# Patient Record
Sex: Female | Born: 1960 | Race: Black or African American | Hispanic: No | Marital: Single | Smoking: Never smoker
Health system: Southern US, Community
[De-identification: ages and names within clinical notes are randomized; demographics above are authoritative.]

## PROBLEM LIST (undated history)

## (undated) DIAGNOSIS — G4733 Obstructive sleep apnea (adult) (pediatric): Secondary | ICD-10-CM

## (undated) DIAGNOSIS — T7840XA Allergy, unspecified, initial encounter: Secondary | ICD-10-CM

## (undated) DIAGNOSIS — I509 Heart failure, unspecified: Secondary | ICD-10-CM

## (undated) DIAGNOSIS — I10 Essential (primary) hypertension: Secondary | ICD-10-CM

## (undated) HISTORY — DX: Allergy, unspecified, initial encounter: T78.40XA

---

## 2017-05-06 ENCOUNTER — Inpatient Hospital Stay (HOSPITAL_COMMUNITY)
Admission: EM | Admit: 2017-05-06 | Discharge: 2017-05-08 | DRG: 292 | Disposition: A | Discharge status: Home / Self Care | Payer: Medicare HMO | Attending: Internal Medicine | Admitting: Internal Medicine

## 2017-05-06 ENCOUNTER — Other Ambulatory Visit: Payer: Self-pay

## 2017-05-06 ENCOUNTER — Encounter (HOSPITAL_COMMUNITY): Payer: Self-pay

## 2017-05-06 ENCOUNTER — Emergency Department (HOSPITAL_COMMUNITY): Payer: Medicare HMO

## 2017-05-06 DIAGNOSIS — G4733 Obstructive sleep apnea (adult) (pediatric): Secondary | ICD-10-CM | POA: Diagnosis present

## 2017-05-06 DIAGNOSIS — R0602 Shortness of breath: Secondary | ICD-10-CM

## 2017-05-06 DIAGNOSIS — I5033 Acute on chronic diastolic (congestive) heart failure: Secondary | ICD-10-CM

## 2017-05-06 DIAGNOSIS — I110 Hypertensive heart disease with heart failure: Secondary | ICD-10-CM | POA: Diagnosis not present

## 2017-05-06 DIAGNOSIS — Z8249 Family history of ischemic heart disease and other diseases of the circulatory system: Secondary | ICD-10-CM

## 2017-05-06 DIAGNOSIS — R0902 Hypoxemia: Secondary | ICD-10-CM

## 2017-05-06 DIAGNOSIS — E119 Type 2 diabetes mellitus without complications: Secondary | ICD-10-CM

## 2017-05-06 DIAGNOSIS — T501X6A Underdosing of loop [high-ceiling] diuretics, initial encounter: Secondary | ICD-10-CM | POA: Diagnosis present

## 2017-05-06 DIAGNOSIS — IMO0002 Reserved for concepts with insufficient information to code with codable children: Secondary | ICD-10-CM

## 2017-05-06 DIAGNOSIS — I509 Heart failure, unspecified: Secondary | ICD-10-CM | POA: Insufficient documentation

## 2017-05-06 DIAGNOSIS — Z6841 Body Mass Index (BMI) 40.0 and over, adult: Secondary | ICD-10-CM

## 2017-05-06 DIAGNOSIS — Z9119 Patient's noncompliance with other medical treatment and regimen: Secondary | ICD-10-CM

## 2017-05-06 DIAGNOSIS — E1165 Type 2 diabetes mellitus with hyperglycemia: Secondary | ICD-10-CM

## 2017-05-06 DIAGNOSIS — E66813 Obesity, class 3: Secondary | ICD-10-CM

## 2017-05-06 HISTORY — DX: Essential (primary) hypertension: I10

## 2017-05-06 HISTORY — DX: Heart failure, unspecified: I50.9

## 2017-05-06 HISTORY — DX: Obstructive sleep apnea (adult) (pediatric): G47.33

## 2017-05-06 LAB — CBC WITH DIFFERENTIAL/PLATELET
BASOS ABS: 0 10*3/uL (ref 0.0–0.1)
Basophils Relative: 0 %
Eosinophils Absolute: 0.3 10*3/uL (ref 0.0–0.7)
Eosinophils Relative: 2 %
HCT: 46.6 % — ABNORMAL HIGH (ref 36.0–46.0)
HEMOGLOBIN: 15.4 g/dL — AB (ref 12.0–15.0)
LYMPHS PCT: 21 %
Lymphs Abs: 2.7 10*3/uL (ref 0.7–4.0)
MCH: 27.4 pg (ref 26.0–34.0)
MCHC: 33 g/dL (ref 30.0–36.0)
MCV: 82.9 fL (ref 78.0–100.0)
Monocytes Absolute: 0.7 10*3/uL (ref 0.1–1.0)
Monocytes Relative: 6 %
NEUTROS ABS: 8.9 10*3/uL — AB (ref 1.7–7.7)
NEUTROS PCT: 71 %
Platelets: 215 10*3/uL (ref 150–400)
RBC: 5.62 MIL/uL — AB (ref 3.87–5.11)
RDW: 16.7 % — ABNORMAL HIGH (ref 11.5–15.5)
WBC: 12.6 10*3/uL — AB (ref 4.0–10.5)

## 2017-05-06 LAB — URINALYSIS, ROUTINE W REFLEX MICROSCOPIC
BILIRUBIN URINE: NEGATIVE
Glucose, UA: NEGATIVE mg/dL
KETONES UR: NEGATIVE mg/dL
LEUKOCYTES UA: NEGATIVE
Nitrite: NEGATIVE
PH: 5 (ref 5.0–8.0)
Protein, ur: NEGATIVE mg/dL
Specific Gravity, Urine: 1.013 (ref 1.005–1.030)

## 2017-05-06 LAB — COMPREHENSIVE METABOLIC PANEL
ALK PHOS: 64 U/L (ref 38–126)
ALT: 30 U/L (ref 14–54)
AST: 28 U/L (ref 15–41)
Albumin: 3.5 g/dL (ref 3.5–5.0)
Anion gap: 11 (ref 5–15)
BUN: 18 mg/dL (ref 6–20)
CALCIUM: 9.6 mg/dL (ref 8.9–10.3)
CHLORIDE: 105 mmol/L (ref 101–111)
CO2: 24 mmol/L (ref 22–32)
CREATININE: 1.29 mg/dL — AB (ref 0.44–1.00)
GFR calc Af Amer: 53 mL/min — ABNORMAL LOW (ref >60)
GFR, EST NON AFRICAN AMERICAN: 45 mL/min — AB (ref >60)
Glucose, Bld: 168 mg/dL — ABNORMAL HIGH (ref 65–99)
Potassium: 3.7 mmol/L (ref 3.5–5.1)
Sodium: 140 mmol/L (ref 135–145)
Total Bilirubin: 0.7 mg/dL (ref 0.3–1.2)
Total Protein: 6.5 g/dL (ref 6.5–8.1)

## 2017-05-06 LAB — BRAIN NATRIURETIC PEPTIDE: B NATRIURETIC PEPTIDE 5: 263.1 pg/mL — AB (ref 0.0–100.0)

## 2017-05-06 LAB — I-STAT TROPONIN, ED
TROPONIN I, POC: 0.01 ng/mL (ref 0.00–0.08)
TROPONIN I, POC: 0.01 ng/mL (ref 0.00–0.08)

## 2017-05-06 MED ORDER — ACETAMINOPHEN 325 MG PO TABS: 650.0000 mg | ORAL_TABLET | Freq: Four times a day (QID) | ORAL | Status: DC | PRN

## 2017-05-06 MED ORDER — FUROSEMIDE 10 MG/ML IJ SOLN
40.0000 mg | Freq: Every day | INTRAMUSCULAR | Status: DC
  Administered 2017-05-07: 40 mg via INTRAVENOUS
  Filled 2017-05-06: qty 4

## 2017-05-06 MED ORDER — ACETAMINOPHEN 650 MG RE SUPP: 650.0000 mg | Freq: Four times a day (QID) | RECTAL | Status: DC | PRN

## 2017-05-06 MED ORDER — SODIUM CHLORIDE 0.9% FLUSH
3.0000 mL | Freq: Two times a day (BID) | INTRAVENOUS | Status: DC
  Administered 2017-05-06 – 2017-05-08 (×3): 3 mL via INTRAVENOUS

## 2017-05-06 MED ORDER — LISINOPRIL 20 MG PO TABS
20.0000 mg | ORAL_TABLET | Freq: Every day | ORAL | Status: DC
  Administered 2017-05-07 – 2017-05-08 (×2): 20 mg via ORAL
  Filled 2017-05-06 (×2): qty 1

## 2017-05-06 MED ORDER — ENOXAPARIN SODIUM 30 MG/0.3ML ~~LOC~~ SOLN
30.0000 mg | SUBCUTANEOUS | Status: DC
  Administered 2017-05-07: 30 mg via SUBCUTANEOUS
  Filled 2017-05-06 (×2): qty 0.3

## 2017-05-06 MED ORDER — FUROSEMIDE 10 MG/ML IJ SOLN
40.0000 mg | Freq: Once | INTRAMUSCULAR | Status: AC
  Administered 2017-05-06: 40 mg via INTRAVENOUS
  Filled 2017-05-06: qty 4

## 2017-05-06 MED ORDER — SODIUM CHLORIDE 0.9% FLUSH: 3.0000 mL | INTRAVENOUS | Status: DC | PRN

## 2017-05-06 MED ORDER — CARVEDILOL 12.5 MG PO TABS
12.5000 mg | ORAL_TABLET | Freq: Two times a day (BID) | ORAL | Status: DC
  Administered 2017-05-07 – 2017-05-08 (×3): 12.5 mg via ORAL
  Filled 2017-05-06 (×3): qty 1

## 2017-05-06 MED ORDER — SODIUM CHLORIDE 0.9 % IV SOLN: 250.0000 mL | INTRAVENOUS | Status: DC | PRN

## 2017-05-06 NOTE — ED Provider Notes (Signed)
[facility] EMERGENCY DEPARTMENT Provider Note   CSN: [id number] Arrival date & time: 05/06/17  1709     History   Chief Complaint Chief Complaint  Patient presents with  . Shortness of Breath    HPI Robin Oliver is a 57 y.o. female.  The history is provided by the patient and medical records. No language interpreter was used.  Shortness of Breath  This is a recurrent problem. The average episode lasts 1 week. The problem occurs continuously.The current episode started more than 2 days ago. The problem has been gradually worsening. Associated symptoms include cough and leg swelling. Pertinent negatives include no fever, no headaches, no rhinorrhea, no neck pain, no sputum production, no hemoptysis, no wheezing, no chest pain, no syncope, no vomiting, no abdominal pain, no rash and no leg pain. She has tried nothing for the symptoms. The treatment provided no relief. Associated medical issues include heart failure.    Past Medical History:  Diagnosis Date  . CHF (congestive heart failure) (HCC)   . Hypertension     There are no active problems to display for this patient.   History reviewed. No pertinent surgical history.  OB History    No data available       Home Medications    Prior to Admission medications   Not on File    Family History No family history on file.  Social History Social History   Tobacco Use  . Smoking status: Never Smoker  . Smokeless tobacco: Never Used  Substance Use Topics  . Alcohol use: No    Frequency: Never  . Drug use: No     Allergies   Patient has no allergy information on record.   Review of Systems Review of Systems  Constitutional: Positive for fatigue. Negative for chills, diaphoresis and fever.  HENT: Negative for congestion and rhinorrhea.   Respiratory: Positive for cough, chest tightness and shortness of breath. Negative for hemoptysis, sputum production, choking, wheezing and stridor.     Cardiovascular: Positive for leg swelling. Negative for chest pain and syncope.  Gastrointestinal: Negative for abdominal pain, constipation, diarrhea, nausea and vomiting.  Genitourinary: Negative for dysuria, enuresis and flank pain.  Musculoskeletal: Negative for back pain, neck pain and neck stiffness.  Skin: Negative for rash and wound.  Neurological: Negative for weakness, light-headedness, numbness and headaches.  Psychiatric/Behavioral: Negative for agitation.  All other systems reviewed and are negative.    Physical Exam Updated Vital Signs BP (!) 140/95   Pulse 76   Temp 98.2 F (36.8 C) (Oral)   Resp 18   Ht 5\' 4"  (1.626 m)   Wt 131.5 kg (290 lb)   SpO2 96%   BMI 49.78 kg/m   Physical Exam  Constitutional: She is oriented to person, place, and time. She appears well-developed and well-nourished. No distress.  HENT:  Head: Normocephalic.  Nose: Nose normal.  Mouth/Throat: Oropharynx is clear and moist. No oropharyngeal exudate.  Eyes: Conjunctivae and EOM are normal. Pupils are equal, round, and reactive to light.  Neck: Normal range of motion.  Cardiovascular: Normal rate and intact distal pulses.  No murmur heard. Pulmonary/Chest: Effort normal. No respiratory distress. She has no wheezes. She has rales.  Abdominal: Soft. There is no tenderness. There is no guarding.  Musculoskeletal: She exhibits edema. She exhibits no tenderness.  Neurological: She is alert and oriented to person, place, and time. No sensory deficit. She exhibits normal muscle tone.  Skin: Capillary refill takes less than  2 seconds. No rash noted. She is not diaphoretic. No erythema.  Psychiatric: She has a normal mood and affect.  Nursing note and vitals reviewed.    ED Treatments / Results  Labs (all labs ordered are listed, but only abnormal results are displayed) Labs Reviewed  CBC WITH DIFFERENTIAL/PLATELET - Abnormal; Notable for the following components:      Result Value   WBC  12.6 (*)    RBC 5.62 (*)    Hemoglobin 15.4 (*)    HCT 46.6 (*)    RDW 16.7 (*)    Neutro Abs 8.9 (*)    All other components within normal limits  COMPREHENSIVE METABOLIC PANEL - Abnormal; Notable for the following components:   Glucose, Bld 168 (*)    Creatinine, Ser 1.29 (*)    GFR calc non Af Amer 45 (*)    GFR calc Af Amer 53 (*)    All other components within normal limits  BRAIN NATRIURETIC PEPTIDE - Abnormal; Notable for the following components:   B Natriuretic Peptide 263.1 (*)    All other components within normal limits  URINALYSIS, ROUTINE W REFLEX MICROSCOPIC - Abnormal; Notable for the following components:   Hgb urine dipstick LARGE (*)    Bacteria, UA RARE (*)    Squamous Epithelial / LPF 0-5 (*)    All other components within normal limits  URINE CULTURE  HIV ANTIBODY (ROUTINE TESTING)  COMPREHENSIVE METABOLIC PANEL  CBC  TSH  TROPONIN I  TROPONIN I  TROPONIN I  I-STAT TROPONIN, ED  I-STAT TROPONIN, ED    EKG  EKG Interpretation  Date/Time:  Sunday May 06 2017 17:38:26 EST Ventricular Rate:  77 PR Interval:    QRS Duration: 88 QT Interval:  400 QTC Calculation: 453 R Axis:   6 Text Interpretation:  Sinus rhythm Baseline wander in lead(s) III aVF No prior ECG for comparison.  No STEMI Confirmed by Theda Belfast ([id number]) on 05/06/2017 7:36:22 PM       Radiology Dg Chest 2 View  Result Date: 05/06/2017 CLINICAL DATA:  Shortness of breath with exertion, hypoxia, cough, history CHF EXAM: CHEST  2 VIEW COMPARISON:  None FINDINGS: Enlargement of cardiac silhouette. Mediastinal contours and pulmonary vascularity normal. Lungs clear. No acute infiltrate, pleural effusion or pneumothorax. Bones unremarkable. IMPRESSION: Enlargement of cardiac silhouette without acute infiltrate. Electronically Signed   By: Ulyses Southward M.D.   On: 05/06/2017 18:40    Procedures Procedures (including critical care time)  Medications Ordered in ED Medications    carvedilol (COREG) tablet 12.5 mg (not administered)  lisinopril (PRINIVIL,ZESTRIL) tablet 20 mg (not administered)  enoxaparin (LOVENOX) injection 30 mg (not administered)  sodium chloride flush (NS) 0.9 % injection 3 mL (not administered)  sodium chloride flush (NS) 0.9 % injection 3 mL (not administered)  0.9 %  sodium chloride infusion (not administered)  acetaminophen (TYLENOL) tablet 650 mg (not administered)    Or  acetaminophen (TYLENOL) suppository 650 mg (not administered)  furosemide (LASIX) injection 40 mg (not administered)  furosemide (LASIX) injection 40 mg (40 mg Intravenous Given 05/06/17 2217)     Initial Impression / Assessment and Plan / ED Course  I have reviewed the triage vital signs and the nursing notes.  Pertinent labs & imaging results that were available during my care of the patient were reviewed by me and considered in my medical decision making (see chart for details).     Robin Oliver is a 57 y.o. female with a past medical  history significant for congestive heart failure with a reported EF of 10% previously and hypertension who presents with peripheral edema, shortness of breath with exertion, and hypoxia with ambulation.  According to patient, due to recently moving to this area and changing her insurance company, she has not had her Lasix for the last week.  She reports that she has developed peripheral edema in her legs, abdomen, and her arms.  She reports that she is having shortness of breath and severe fatigue.  She reports that when she tries to ambulate she gets very winded which is different than her norm.  She denies chest pain or chest pressure.  She says that she has had a cough with some productive phlegm since she moved to the area but thinks this may be just allergies.  She denies fevers, chills, nausea, vomiting.  On exam, patient has decreased breath sounds in the bases bilaterally as well as some crackles.  No wheezing or rhonchi  appreciated.  No chest tenderness or back tenderness.  No abdominal tenderness.  Patient has pitting edema in both legs and she feels her hands are swollen.  No focal neurologic deficits.  Patient had symmetric pulses in upper and lower extremities.  Clinically I am concerned about fluid overload given the reported missed doses of Lasix.  According to the patient and EMS, patient was found to be hypoxic with ambulation at urgent care with a sat in the 80s.  At rest, she is in the low 90s.  This prompted the transfer for further workup.  Patient will have her ambulatory oxygen rechecked and will have screening laboratory testing and imaging.  Given the cough I will look for pneumonia.  I will also look for kidney dysfunction with the edema.  Occult infection could also contribute to fatigue.  Anticipate reassessment after workup.  Patient had negative troponin x2.  Urinalysis did not show infection.  CBC showed evidence of mild leukocytosis and elevated hemoglobin, considering intravascular dehydration despite my clinical concern for overall fluid overload.  BNP was however elevated at 263.  There is no prior for comparison and patient is obese so I am concerned this is reflecting a significant amount of fluid overload.  Chest x-ray showed enlarged cardiac silhouette without acute infiltrate.  No pneumonia however clinically, she had rales in the lungs consistent with pulmonary edema.  Patient was ambulated and became very short of breath.  According to the tech, patient's heart rate jumped into the 110's to 120s.  Oxygen saturation remained in the low 90s but patient had to stop because she was so winded.  This is similar to the patient getting hypoxic at the doctor's office earlier into the 80s.  Clinically, I am still concerned of fluid overload and CHF exacerbation.  Given patient's reported 10% EF and the fluid overload, I feel patient needs admission.  Patient does not feel safe going home given  the shortness of breath when she takes several steps.  Patient given dose of IV Lasix and will be called for admission.    Final Clinical Impressions(s) / ED Diagnoses   Final diagnoses:  Hypoxia  Shortness of breath  Acute on chronic congestive heart failure, unspecified heart failure type (HCC)    Clinical Impression: 1. Hypoxia   2. Shortness of breath   3. Acute on chronic congestive heart failure, unspecified heart failure type (HCC)     Disposition: Admit  This note was prepared with assistance of Dragon voice recognition software. Occasional wrong-word or  sound-a-like substitutions may have occurred due to the inherent limitations of voice recognition software.     Tegeler, Canary Brimhristopher J, MD 05/06/17 [phone]

## 2017-05-06 NOTE — ED Triage Notes (Signed)
Pt brought in by [facility] from home for SOB on exertion. Pt states she moved to [place] a month ago and has been put of her lasix x4 days. Pt endorses a cough that worsens at night. Pt is A+Ox4 and is in no apparent distress at this time. Pt ambulated to bathroom with steady gait.

## 2017-05-06 NOTE — ED Notes (Signed)
Patient transported to X-ray 

## 2017-05-06 NOTE — ED Notes (Signed)
Pt ambulated while on pulse oximetry, Pt's O2 sats remained in the 92-94 range throughout the walk HR in the 115-127 range.

## 2017-05-06 NOTE — H&P (Addendum)
[facility] H&P   Patient Demographics:    Robin Oliver, is a 57 y.o. female  MRN: [id number]   DOB - 07/05/1960  Admit Date - 05/06/2017  Outpatient Primary MD for the patient is Shawn Stall, MD  Referring MD/NP/PA: Chriss Czar  Outpatient Specialists:    Cardiology [facility]  Patient coming from: home  Chief Complaint  Patient presents with  . Shortness of Breath      HPI:    Robin Oliver  is a 57 y.o. female, w hypertension, CHF (EF 10% reported by pt), apparently c/o dyspnea and edema. And weight gain.  Pt notes that she has been without her lasix for 7 days, and during that time her feet have been swelling and she has gained weight and her dyspnea was worse.  Pt denies fever, chills, cough, cp, palp, n/v, diarrhea, brbpr.  Pt was seen at urgent care and told to come to ED.   In ED, pt given lasix and pox >90, but pt feels still clinically dyspneic.  And afraid to go home.   CXR  IMPRESSION: Enlargement of cardiac silhouette without acute infiltrate.  Wbc 12.6, Hgb 15.4, Plt 215 Na 140, K 3.7, Bun 18, Creatinine 1.29 Ast 28, Alt 30 BNP 263.1  Urinalysis Rbc 6-30   Trop 0.01   Pt will be admitted for CHF.         Review of systems:    In addition to the HPI above,  No Fever-chills, No Headache, No changes with Vision or hearing, No problems swallowing food or Liquids, No Chest pain, Cough  No Abdominal pain, No Nausea or Vommitting, Bowel movements are regular, No Blood in stool or Urine, No dysuria, No new skin rashes or bruises, No new joints pains-aches,  No new weakness, tingling, numbness in any extremity, No recent weight gain or loss, No polyuria, polydypsia or polyphagia, No significant Mental Stressors.  A full 10 point Review of Systems was done, except as stated above, all other Review of Systems were negative.   With Past History  of the following :    Past Medical History:  Diagnosis Date  . CHF (congestive heart failure) (HCC)   . Hypertension   . OSA (obstructive sleep apnea)       History reviewed. No pertinent surgical history.    Social History:     Social History   Tobacco Use  . Smoking status: Never Smoker  . Smokeless tobacco: Never Used  Substance Use Topics  . Alcohol use: No    Frequency: Never     Lives -  At home Mobility -  Walks by self   Family History :     Family History  Problem Relation Age of Onset  . Congestive Heart Failure Mother       Home Medications:   Prior to Admission medications   Medication Sig Start Date  End Date Taking? Authorizing Provider  carvedilol (COREG) 12.5 MG tablet Take 12.5 mg by mouth 2 (two) times daily with a meal.   Yes [provider]  furosemide (LASIX) 40 MG tablet Take 40 mg by mouth daily.   Yes [provider]  lisinopril (PRINIVIL,ZESTRIL) 20 MG tablet Take 20 mg by mouth daily.   Yes [provider]     Allergies:     Allergies  Allergen Reactions  . Peanut-Containing Drug Products Anaphylaxis     Physical Exam:   Vitals  Blood pressure 129/63, pulse 88, temperature 98.2 F (36.8 C), temperature source Oral, resp. rate 20, height 5\' 4"  (1.626 m), weight 131.5 kg (290 lb), SpO2 96 %.   1. General lying in bed in NAD,    2. Normal affect and insight, Not Suicidal or Homicidal, Awake Alert, Oriented X 3.  3. No F.N deficits, ALL C.Nerves Intact, Strength 5/5 all 4 extremities, Sensation intact all 4 extremities, Plantars down going.  4. Ears and Eyes appear Normal, Conjunctivae clear, PERRLA. Moist Oral Mucosa.  5. Supple Neck, No JVD, No cervical lymphadenopathy appriciated, No Carotid Bruits.  6. Symmetrical Chest wall movement, trace crackles lung base, no wheezing.   7. RRR, No Gallops, Rubs or Murmurs, No Parasternal Heave.  8. Positive Bowel Sounds, Abdomen Soft, No tenderness,  No organomegaly appriciated,No rebound -guarding or rigidity.  9.  No Cyanosis,  Feet trace edema.   10. Good muscle tone,  joints appear normal , no effusions, Normal ROM.  11. No Palpable Lymph Nodes in Neck or Axillae     Data Review:    CBC Recent Labs  Lab 05/06/17 1809  WBC 12.6*  HGB 15.4*  HCT 46.6*  PLT 215  MCV 82.9  MCH 27.4  MCHC 33.0  RDW 16.7*  LYMPHSABS 2.7  MONOABS 0.7  EOSABS 0.3  BASOSABS 0.0   ------------------------------------------------------------------------------------------------------------------  Chemistries  Recent Labs  Lab 05/06/17 1809  NA 140  K 3.7  CL 105  CO2 24  GLUCOSE 168*  BUN 18  CREATININE 1.29*  CALCIUM 9.6  AST 28  ALT 30  ALKPHOS 64  BILITOT 0.7   ------------------------------------------------------------------------------------------------------------------ estimated creatinine clearance is 65.6 mL/min (A) (by C-G formula based on SCr of 1.29 mg/dL (H)). ------------------------------------------------------------------------------------------------------------------ No results for input(s): TSH, T4TOTAL, T3FREE, THYROIDAB in the last 72 hours.  Invalid input(s): FREET3  Coagulation profile No results for input(s): INR, PROTIME in the last 168 hours. ------------------------------------------------------------------------------------------------------------------- No results for input(s): DDIMER in the last 72 hours. -------------------------------------------------------------------------------------------------------------------  Cardiac Enzymes No results for input(s): CKMB, TROPONINI, MYOGLOBIN in the last 168 hours.  Invalid input(s): CK ------------------------------------------------------------------------------------------------------------------    Component Value Date/Time   BNP 263.1 (H) 05/06/2017 1809      ---------------------------------------------------------------------------------------------------------------  Urinalysis    Component Value Date/Time   COLORURINE YELLOW 05/06/2017 1815   APPEARANCEUR CLEAR 05/06/2017 1815   LABSPEC 1.013 05/06/2017 1815   PHURINE 5.0 05/06/2017 1815   GLUCOSEU NEGATIVE 05/06/2017 1815   HGBUR LARGE (A) 05/06/2017 1815   BILIRUBINUR NEGATIVE 05/06/2017 1815   KETONESUR NEGATIVE 05/06/2017 1815   PROTEINUR NEGATIVE 05/06/2017 1815   NITRITE NEGATIVE 05/06/2017 1815   LEUKOCYTESUR NEGATIVE 05/06/2017 1815    ----------------------------------------------------------------------------------------------------------------   Imaging Results:    Dg Chest 2 View  Result Date: 05/06/2017 CLINICAL DATA:  Shortness of breath with exertion, hypoxia, cough, history CHF EXAM: CHEST  2 VIEW COMPARISON:  None FINDINGS: Enlargement of cardiac silhouette. Mediastinal contours and pulmonary vascularity normal. Lungs  clear. No acute infiltrate, pleural effusion or pneumothorax. Bones unremarkable. IMPRESSION: Enlargement of cardiac silhouette without acute infiltrate. Electronically Signed   By: Ulyses Southward M.D.   On: 05/06/2017 18:40       Assessment & Plan:    Active Problems:   CHF (congestive heart failure) (HCC)    Dyspnea secondary to Acute on Chronic Systolic CHF Tele Trop I q6h x3 Check tsh Check cardiac echo Start  Lasix 40mg  iv qday Cont Carvedilol Cardiology consult , sent by email for AM consultation  OSA , pt is not wearing cpap Recommend outpatient follow up   Hyperglycemia Check hga1c  DVT Prophylaxis Lovenox - SCDs  AM Labs Ordered, also please review Full Orders  Family Communication: Admission, patients condition and plan of care including tests being ordered have been discussed with the patient  who indicate understanding and agree with the plan and Code Status.  Code Status FULL CODE  Likely DC to   home  Condition GUARDED    Consults called:  Cardiology by email  Admission status: observation   Time spent in minutes : 45   Pearson Grippe M.D on 05/06/2017 at 11:06 PM  Between 7am to 7pm - Pager - [phone]  . After 7pm go to [email or web address] - password [email or web address]  [facility] - Office  [phone]

## 2017-05-06 NOTE — ED Notes (Signed)
Lab called- BNP in process

## 2017-05-07 ENCOUNTER — Other Ambulatory Visit (HOSPITAL_COMMUNITY): Payer: Medicare HMO

## 2017-05-07 ENCOUNTER — Observation Stay (HOSPITAL_COMMUNITY): Payer: Medicare HMO

## 2017-05-07 DIAGNOSIS — I509 Heart failure, unspecified: Secondary | ICD-10-CM

## 2017-05-07 DIAGNOSIS — R0602 Shortness of breath: Secondary | ICD-10-CM

## 2017-05-07 DIAGNOSIS — Z8249 Family history of ischemic heart disease and other diseases of the circulatory system: Secondary | ICD-10-CM | POA: Diagnosis not present

## 2017-05-07 DIAGNOSIS — I5033 Acute on chronic diastolic (congestive) heart failure: Secondary | ICD-10-CM | POA: Diagnosis present

## 2017-05-07 DIAGNOSIS — G4733 Obstructive sleep apnea (adult) (pediatric): Secondary | ICD-10-CM | POA: Diagnosis present

## 2017-05-07 DIAGNOSIS — E1165 Type 2 diabetes mellitus with hyperglycemia: Secondary | ICD-10-CM | POA: Diagnosis present

## 2017-05-07 DIAGNOSIS — Z9119 Patient's noncompliance with other medical treatment and regimen: Secondary | ICD-10-CM | POA: Diagnosis not present

## 2017-05-07 DIAGNOSIS — Z6841 Body Mass Index (BMI) 40.0 and over, adult: Secondary | ICD-10-CM | POA: Diagnosis not present

## 2017-05-07 DIAGNOSIS — T501X6A Underdosing of loop [high-ceiling] diuretics, initial encounter: Secondary | ICD-10-CM | POA: Diagnosis present

## 2017-05-07 DIAGNOSIS — R0902 Hypoxemia: Secondary | ICD-10-CM | POA: Diagnosis present

## 2017-05-07 DIAGNOSIS — I110 Hypertensive heart disease with heart failure: Secondary | ICD-10-CM | POA: Diagnosis present

## 2017-05-07 LAB — COMPREHENSIVE METABOLIC PANEL
ALT: 29 U/L (ref 14–54)
ANION GAP: 12 (ref 5–15)
AST: 25 U/L (ref 15–41)
Albumin: 3.4 g/dL — ABNORMAL LOW (ref 3.5–5.0)
Alkaline Phosphatase: 63 U/L (ref 38–126)
BILIRUBIN TOTAL: 0.9 mg/dL (ref 0.3–1.2)
BUN: 19 mg/dL (ref 6–20)
CHLORIDE: 106 mmol/L (ref 101–111)
CO2: 22 mmol/L (ref 22–32)
Calcium: 9.2 mg/dL (ref 8.9–10.3)
Creatinine, Ser: 1.13 mg/dL — ABNORMAL HIGH (ref 0.44–1.00)
GFR, EST NON AFRICAN AMERICAN: 53 mL/min — AB (ref >60)
Glucose, Bld: 213 mg/dL — ABNORMAL HIGH (ref 65–99)
POTASSIUM: 3.6 mmol/L (ref 3.5–5.1)
Sodium: 140 mmol/L (ref 135–145)
TOTAL PROTEIN: 6.8 g/dL (ref 6.5–8.1)

## 2017-05-07 LAB — CBC
HEMATOCRIT: 44.1 % (ref 36.0–46.0)
HEMOGLOBIN: 15 g/dL (ref 12.0–15.0)
MCH: 28 pg (ref 26.0–34.0)
MCHC: 34 g/dL (ref 30.0–36.0)
MCV: 82.4 fL (ref 78.0–100.0)
Platelets: 229 10*3/uL (ref 150–400)
RBC: 5.35 MIL/uL — AB (ref 3.87–5.11)
RDW: 16.3 % — ABNORMAL HIGH (ref 11.5–15.5)
WBC: 10.9 10*3/uL — AB (ref 4.0–10.5)

## 2017-05-07 LAB — URINE CULTURE: Culture: NO GROWTH

## 2017-05-07 LAB — TROPONIN I

## 2017-05-07 LAB — TSH: TSH: 1.793 u[IU]/mL (ref 0.350–4.500)

## 2017-05-07 MED ORDER — FUROSEMIDE 10 MG/ML IJ SOLN
40.0000 mg | Freq: Two times a day (BID) | INTRAMUSCULAR | Status: DC
  Administered 2017-05-07: 40 mg via INTRAVENOUS
  Filled 2017-05-07: qty 4

## 2017-05-07 MED ORDER — ENOXAPARIN SODIUM 40 MG/0.4ML ~~LOC~~ SOLN
40.0000 mg | SUBCUTANEOUS | Status: DC
  Administered 2017-05-08: 40 mg via SUBCUTANEOUS
  Filled 2017-05-07: qty 0.4

## 2017-05-07 NOTE — ED Notes (Signed)
Called 3E to give report .  Did not answer phone

## 2017-05-07 NOTE — Progress Notes (Signed)
Patient states she has sleep apnea (also listed in history), says she has been wanting a CPAP, but is trying to see if her new insurance will cover it.

## 2017-05-07 NOTE — Progress Notes (Signed)
  Echocardiogram 2D Echocardiogram was attempted but patient wanted to eat will try again tomorrow.   Tye SavoyCasey N Seanpatrick Maisano 05/07/2017, 1:42 PM

## 2017-05-07 NOTE — Progress Notes (Signed)
PROGRESS NOTE    Robin Oliver  WUJ:[id number] DOB: 1960/05/02 DOA: 05/06/2017 PCP: Shawn Stallodd, Dana L, MD   Outpatient Specialists:     Brief Narrative:  Robin Oliver  is a 57 y.o. female, w hypertension, CHF (EF 10% reported by pt), apparently c/o dyspnea and edema. And weight gain.  Pt notes that she has been without her lasix for 7 days, and during that time her feet have been swelling and she has gained weight and her dyspnea was worse.  Pt denies fever, chills, cough, cp, palp, n/v, diarrhea, brbpr.  Pt was seen at urgent care and told to come to ED.   In ED, pt given lasix and pox >90, but pt feels still clinically dyspneic.  And afraid to go home.       Assessment & Plan:   Active Problems:   CHF (congestive heart failure) (HCC)   Dyspnea secondary to Acute on Chronic Systolic CHF- self reported to be 10% -unable to pull in records from prior hospitals-- both [facility] as well as [facility] -history is very suspicious as not able to provide detail or find information in care everywhere -per patient, ran out of medications as [employer] never sent through mail -CE negative - tsh normal Check cardiac echo - Lasix 40mg  iv qday Cont Carvedilol  OSA , pt is not wearing cpap Recommend outpatient follow up   Hyperglycemia -ordered HgbA1c  Morbid obesity Body mass index is 49.78 kg/m.    DVT prophylaxis:  Lovenox   Code Status: Full Code   Family Communication:   Disposition Plan:     Consultants:      Subjective: Urinating large amounts, still gets SOB when laying back  Objective: Vitals:   05/07/17 0847 05/07/17 1130 05/07/17 1130 05/07/17 1220  BP: 140/80   (!) 153/95  Pulse: 77   74  Resp:  14  (!) 28  Temp:    (!) 97.4 F (36.3 C)  TempSrc:    Oral  SpO2:   99% 97%  Weight:      Height:        Intake/Output Summary (Last 24 hours) at 05/07/2017 1307 Last data filed at 05/07/2017 0308 Gross per 24 hour  Intake 360 ml  Output 1250 ml  Net  -890 ml   Filed Weights   05/06/17 1725  Weight: 131.5 kg (290 lb)    Examination:  General exam: Appears calm and comfortable  Respiratory system: few crackles at bases Cardiovascular system: rrr Gastrointestinal system: obese Central nervous system: Alert and oriented. No focal neurological deficits. Extremities: Symmetric 5 x 5 power. Skin: No rashes, lesions or ulcers     Data Reviewed: I have personally reviewed following labs and imaging studies  CBC: Recent Labs  Lab 05/06/17 1809 05/07/17 0515  WBC 12.6* 10.9*  NEUTROABS 8.9*  --   HGB 15.4* 15.0  HCT 46.6* 44.1  MCV 82.9 82.4  PLT 215 229   Basic Metabolic Panel: Recent Labs  Lab 05/06/17 1809 05/07/17 0515  NA 140 140  K 3.7 3.6  CL 105 106  CO2 24 22  GLUCOSE 168* 213*  BUN 18 19  CREATININE 1.29* 1.13*  CALCIUM 9.6 9.2   GFR: Estimated Creatinine Clearance: 74.9 mL/min (A) (by C-G formula based on SCr of 1.13 mg/dL (H)). Liver Function Tests: Recent Labs  Lab 05/06/17 1809 05/07/17 0515  AST 28 25  ALT 30 29  ALKPHOS 64 63  BILITOT 0.7 0.9  PROT 6.5 6.8  ALBUMIN 3.5  3.4*   No results for input(s): LIPASE, AMYLASE in the last 168 hours. No results for input(s): AMMONIA in the last 168 hours. Coagulation Profile: No results for input(s): INR, PROTIME in the last 168 hours. Cardiac Enzymes: Recent Labs  Lab 05/07/17 0022 05/07/17 0515  TROPONINI <0.03 <0.03   BNP (last 3 results) No results for input(s): PROBNP in the last 8760 hours. HbA1C: No results for input(s): HGBA1C in the last 72 hours. CBG: No results for input(s): GLUCAP in the last 168 hours. Lipid Profile: No results for input(s): CHOL, HDL, LDLCALC, TRIG, CHOLHDL, LDLDIRECT in the last 72 hours. Thyroid Function Tests: Recent Labs    05/07/17 0022  TSH 1.793   Anemia Panel: No results for input(s): VITAMINB12, FOLATE, FERRITIN, TIBC, IRON, RETICCTPCT in the last 72 hours. Urine analysis:    Component  Value Date/Time   COLORURINE YELLOW 05/06/2017 1815   APPEARANCEUR CLEAR 05/06/2017 1815   LABSPEC 1.013 05/06/2017 1815   PHURINE 5.0 05/06/2017 1815   GLUCOSEU NEGATIVE 05/06/2017 1815   HGBUR LARGE (A) 05/06/2017 1815   BILIRUBINUR NEGATIVE 05/06/2017 1815   KETONESUR NEGATIVE 05/06/2017 1815   PROTEINUR NEGATIVE 05/06/2017 1815   NITRITE NEGATIVE 05/06/2017 1815   LEUKOCYTESUR NEGATIVE 05/06/2017 1815     )No results found for this or any previous visit (from the past 240 hour(s)).    Anti-infectives (From admission, onward)   None       Radiology Studies: Dg Chest 2 View  Result Date: 05/06/2017 CLINICAL DATA:  Shortness of breath with exertion, hypoxia, cough, history CHF EXAM: CHEST  2 VIEW COMPARISON:  None FINDINGS: Enlargement of cardiac silhouette. Mediastinal contours and pulmonary vascularity normal. Lungs clear. No acute infiltrate, pleural effusion or pneumothorax. Bones unremarkable. IMPRESSION: Enlargement of cardiac silhouette without acute infiltrate. Electronically Signed   By: Ulyses Southward M.D.   On: 05/06/2017 18:40        Scheduled Meds: . carvedilol  12.5 mg Oral BID WC  . enoxaparin (LOVENOX) injection  30 mg Subcutaneous Q24H  . furosemide  40 mg Intravenous BID  . lisinopril  20 mg Oral Daily  . sodium chloride flush  3 mL Intravenous Q12H   Continuous Infusions: . sodium chloride       LOS: 0 days    Time spent: 25 min    Joseph Art, DO [facility] Pager [phone]  If 7PM-7AM, please contact night-coverage [email or web address] Password [facility] 05/07/2017, 1:07 PM

## 2017-05-08 ENCOUNTER — Inpatient Hospital Stay (HOSPITAL_COMMUNITY): Payer: Medicare HMO

## 2017-05-08 DIAGNOSIS — IMO0002 Reserved for concepts with insufficient information to code with codable children: Secondary | ICD-10-CM

## 2017-05-08 DIAGNOSIS — I5033 Acute on chronic diastolic (congestive) heart failure: Secondary | ICD-10-CM

## 2017-05-08 DIAGNOSIS — R0602 Shortness of breath: Secondary | ICD-10-CM

## 2017-05-08 DIAGNOSIS — E1165 Type 2 diabetes mellitus with hyperglycemia: Secondary | ICD-10-CM

## 2017-05-08 DIAGNOSIS — I509 Heart failure, unspecified: Secondary | ICD-10-CM

## 2017-05-08 LAB — HEMOGLOBIN A1C
Hgb A1c MFr Bld: 8.6 % — ABNORMAL HIGH (ref 4.8–5.6)
MEAN PLASMA GLUCOSE: 200.12 mg/dL

## 2017-05-08 LAB — HIV ANTIBODY (ROUTINE TESTING W REFLEX): HIV SCREEN 4TH GENERATION: NONREACTIVE

## 2017-05-08 LAB — ECHOCARDIOGRAM COMPLETE
HEIGHTINCHES: 64 in
Weight: 4659.2 oz

## 2017-05-08 LAB — BASIC METABOLIC PANEL
Anion gap: 10 (ref 5–15)
BUN: 17 mg/dL (ref 6–20)
CHLORIDE: 106 mmol/L (ref 101–111)
CO2: 24 mmol/L (ref 22–32)
CREATININE: 1.08 mg/dL — AB (ref 0.44–1.00)
Calcium: 9.2 mg/dL (ref 8.9–10.3)
GFR calc non Af Amer: 56 mL/min — ABNORMAL LOW (ref >60)
GLUCOSE: 173 mg/dL — AB (ref 65–99)
Potassium: 3.7 mmol/L (ref 3.5–5.1)
Sodium: 140 mmol/L (ref 135–145)

## 2017-05-08 LAB — CBC
HEMATOCRIT: 45.8 % (ref 36.0–46.0)
HEMOGLOBIN: 15.5 g/dL — AB (ref 12.0–15.0)
MCH: 27.9 pg (ref 26.0–34.0)
MCHC: 33.8 g/dL (ref 30.0–36.0)
MCV: 82.4 fL (ref 78.0–100.0)
Platelets: 216 10*3/uL (ref 150–400)
RBC: 5.56 MIL/uL — ABNORMAL HIGH (ref 3.87–5.11)
RDW: 16.3 % — ABNORMAL HIGH (ref 11.5–15.5)
WBC: 11.3 10*3/uL — ABNORMAL HIGH (ref 4.0–10.5)

## 2017-05-08 LAB — GLUCOSE, CAPILLARY: GLUCOSE-CAPILLARY: 202 mg/dL — AB (ref 65–99)

## 2017-05-08 MED ORDER — LIVING WELL WITH DIABETES BOOK
Freq: Once | Status: AC
  Administered 2017-05-08: 12:00:00
  Filled 2017-05-08: qty 1

## 2017-05-08 MED ORDER — CARVEDILOL 12.5 MG PO TABS: 12.5000 mg | ORAL_TABLET | Freq: Two times a day (BID) | ORAL | 0 refills | Status: DC

## 2017-05-08 MED ORDER — BD ASSURE BPM/AUTO ARM CUFF MISC: 1.0000 | Freq: Once | 0 refills | Status: AC

## 2017-05-08 MED ORDER — LISINOPRIL 20 MG PO TABS: 20.0000 mg | ORAL_TABLET | Freq: Every day | ORAL | 0 refills | Status: DC

## 2017-05-08 MED ORDER — INSULIN ASPART 100 UNIT/ML ~~LOC~~ SOLN: 0.0000 [IU] | Freq: Three times a day (TID) | SUBCUTANEOUS | Status: DC

## 2017-05-08 MED ORDER — FUROSEMIDE 40 MG PO TABS: 40.0000 mg | ORAL_TABLET | Freq: Every day | ORAL | 0 refills | Status: DC

## 2017-05-08 MED ORDER — BLOOD GLUCOSE MONITOR KIT: PACK | 0 refills | Status: AC

## 2017-05-08 MED ORDER — FUROSEMIDE 40 MG PO TABS
40.0000 mg | ORAL_TABLET | Freq: Every day | ORAL | Status: DC
  Administered 2017-05-08: 40 mg via ORAL
  Filled 2017-05-08: qty 1

## 2017-05-08 NOTE — Progress Notes (Signed)
Discharge instructions reviewed with pt. Pt verbalizes understanding. Pt belongings with pt. Pt is not in distress. Pt discharged via wheelchair.

## 2017-05-08 NOTE — Plan of Care (Signed)
  Health Behavior/Discharge Planning: Ability to manage health-related needs will improve 05/08/2017 0159 - Progressing by Jeanella Flatteryhomas, Shahmeer Bunn T, RN   Education: Knowledge of General Education information will improve 05/08/2017 0159 - Progressing by Jeanella Flatteryhomas, Yizel Canby T, RN   Clinical Measurements: Ability to maintain clinical measurements within normal limits will improve 05/08/2017 0159 - Progressing by Jeanella Flatteryhomas, Yerachmiel Spinney T, RN   Elimination: Will not experience complications related to urinary retention 05/08/2017 0159 - Progressing by Jeanella Flatteryhomas, Jalisha Enneking T, RN

## 2017-05-08 NOTE — Progress Notes (Signed)
Patient concerned with ventilation in her house, thinks her house (since it is older) may be contributing her breathing problems.

## 2017-05-08 NOTE — Progress Notes (Signed)
Plan to d/c later today-- just moved to this area-- needs to establish with new PCP, cardiology.  Needs diabetic education, resistant to medications at this time.  Asking about a home CHF program the "night nurse" told her about.  Await echo. Plan to d/c later today Marlin CanaryJessica Vann DO

## 2017-05-08 NOTE — Care Management Note (Signed)
Case Management Note  Patient Details  Name: Robin Oliver MRN: [id number] Date of Birth: [birth year]  Subjective/Objective:   Heart Failure                Action/Plan: [place]; she is requesting a PCP in [place]; patient was agreeable to have CM assist her; apt made with Dr Betty SwazilandJordan Feb 6,2019 at 9 am; she has private insurance with [employer] with prescription drug coverage; she [facility]; No DME; [facility]. She is trying to start back running again. She cooks at home but states that she is involved with a lot of projects at her [employer] and does not eat properly and is requesting for someone to talk to her about her diet. Nutritional Consult placed for making wise food choices.  Expected Discharge Date:  05/10/17               Expected Discharge Plan:  Home/Self Care  In-House Referral:   Nutritional   Discharge planning Services  CM Consult  Status of Service:  In process, will continue to follow  Reola MosherChandler, Chayce Robbins L, RN,MHA,BSN [phone] 05/08/2017, 11:23 AM

## 2017-05-08 NOTE — Progress Notes (Signed)
  Echocardiogram 2D Echocardiogram has been performed.  Robin Oliver M 05/08/2017, 10:17 AM 

## 2017-05-08 NOTE — Discharge Summary (Signed)
Physician Discharge Summary  Robin Oliver NOB:[id number] DOB: [birth year] DOA: 05/06/2017  PCP: Youlanda Roys, MD  Admit date: 05/06/2017 Discharge date: 05/08/2017   Recommendations for Outpatient Follow-Up:   1. Needs started on DM meds-- asked patient to bring in log 2. Needs better BP control-- asked patient to bring in log 3. Needs established with PCP and cardiology in [place]   Discharge Diagnosis:   Active Problems:   Acute on chronic diastolic CHF (congestive heart failure) (HCC)   Obesity, Class III, BMI 40-49.9 (morbid obesity) ([place])   Diabetes ([place])   Discharge disposition:  Home  Discharge Condition: Improved.  Diet recommendation: Low sodium, heart healthy.  Carbohydrate-modified  Wound care: None.   History of Present Illness:   Runette Overholt  is a 57 y.o. female, w hypertension, CHF (EF 10% reported by pt), apparently c/o dyspnea and edema. And weight gain.  Pt notes that she has been without her lasix for 7 days, and during that time her feet have been swelling and she has gained weight and her dyspnea was worse.  Pt denies fever, chills, cough, cp, palp, n/v, diarrhea, brbpr.  Pt was seen at urgent care and told to come to ED.   In ED, pt given lasix and pox >90, but pt feels still clinically dyspneic.  And afraid to go home.   CXR  IMPRESSION: Enlargement of cardiac silhouette without acute infiltrate.  Wbc 12.6, Hgb 15.4, Plt 215 Na 140, K 3.7, Bun 18, Creatinine 1.29 Ast 28, Alt 30 BNP 263.1  Urinalysis Rbc 6-30   Trop 0.01   Pt will be admitted for CHF     Hospital Course by Problem:   Dyspnea secondary toAcute on Chronic diastolicCHF- EF self reported to be 10% -per patient, ran out of medications as [employer] never sent through mail -CE negative - tsh normal Echo here shows: grade 2 diastolic CHF -resume home meds-- patient to keep log and bring to PCP for adjustments -per prior record review lots of  non-compliance and refusal of medications  OSA , pt is not wearing cpap Recommend outpatient follow up   DM -HgbA1c >6.4 Refusing medications-- prescription given for glucometer  Morbid obesity Body mass index is 49.78 kg/m      Medical Consultants:    None.   Discharge Exam:   Vitals:   05/08/17 1202 05/08/17 1331  BP: (!) 151/90 (!) 145/79  Pulse: 74 79  Resp: 18   Temp: (!) 97.4 F (36.3 C)   SpO2: 94%    Vitals:   05/08/17 0630 05/08/17 0908 05/08/17 1202 05/08/17 1331  BP: 130/68 (!) 131/58 (!) 151/90 (!) 145/79  Pulse:  85 74 79  Resp:   18   Temp:   (!) 97.4 F (36.3 C)   TempSrc:   Oral   SpO2:   94%   Weight:      Height:        Gen:  NAD    The results of significant diagnostics from this hospitalization (including imaging, microbiology, ancillary and laboratory) are listed below for reference.     Procedures and Diagnostic Studies:   Dg Chest 2 View  Result Date: 05/06/2017 CLINICAL DATA:  Shortness of breath with exertion, hypoxia, cough, history CHF EXAM: CHEST  2 VIEW COMPARISON:  None FINDINGS: Enlargement of cardiac silhouette. Mediastinal contours and pulmonary vascularity normal. Lungs clear. No acute infiltrate, pleural effusion or pneumothorax. Bones unremarkable. IMPRESSION: Enlargement of cardiac silhouette without acute infiltrate. Electronically Signed  By: Lavonia Dana M.D.   On: 05/06/2017 18:40     Labs:   Basic Metabolic Panel: Recent Labs  Lab 05/06/17 1809 05/07/17 0515 05/08/17 0541  NA 140 140 140  K 3.7 3.6 3.7  CL 105 106 106  CO2 24 22 24   GLUCOSE 168* 213* 173*  BUN 18 19 17   CREATININE 1.29* 1.13* 1.08*  CALCIUM 9.6 9.2 9.2   GFR Estimated Creatinine Clearance: 78.7 mL/min (A) (by C-G formula based on SCr of 1.08 mg/dL (H)). Liver Function Tests: Recent Labs  Lab 05/06/17 1809 05/07/17 0515  AST 28 25  ALT 30 29  ALKPHOS 64 63  BILITOT 0.7 0.9  PROT 6.5 6.8  ALBUMIN 3.5 3.4*   No  results for input(s): LIPASE, AMYLASE in the last 168 hours. No results for input(s): AMMONIA in the last 168 hours. Coagulation profile No results for input(s): INR, PROTIME in the last 168 hours.  CBC: Recent Labs  Lab 05/06/17 1809 05/07/17 0515 05/08/17 0541  WBC 12.6* 10.9* 11.3*  NEUTROABS 8.9*  --   --   HGB 15.4* 15.0 15.5*  HCT 46.6* 44.1 45.8  MCV 82.9 82.4 82.4  PLT 215 229 216   Cardiac Enzymes: Recent Labs  Lab 05/07/17 0022 05/07/17 0515 05/07/17 1515  TROPONINI <0.03 <0.03 <0.03   BNP: Invalid input(s): POCBNP CBG: Recent Labs  Lab 05/08/17 1146  GLUCAP 202*   D-Dimer No results for input(s): DDIMER in the last 72 hours. Hgb A1c Recent Labs    05/08/17 0541  HGBA1C 8.6*   Lipid Profile No results for input(s): CHOL, HDL, LDLCALC, TRIG, CHOLHDL, LDLDIRECT in the last 72 hours. Thyroid function studies Recent Labs    05/07/17 0022  TSH 1.793   Anemia work up No results for input(s): VITAMINB12, FOLATE, FERRITIN, TIBC, IRON, RETICCTPCT in the last 72 hours. Microbiology Recent Results (from the past 240 hour(s))  Urine culture     Status: None   Collection Time: 05/06/17  6:17 PM  Result Value Ref Range Status   Specimen Description URINE, CLEAN CATCH  Final   Special Requests NONE  Final   Culture NO GROWTH  Final   Report Status 05/07/2017 FINAL  Final     Discharge Instructions:   Discharge Instructions    Diet - low sodium heart healthy   Complete by:  As directed    Diet Carb Modified   Complete by:  As directed    Discharge instructions   Complete by:  As directed    Check BP 1x/day at different times, keep log and bring to PCP so she can adjust BP medications Glucometer also given to check blood sugars- bring log to PCP-- fasting AM and 1-2 hours after meals   Increase activity slowly   Complete by:  As directed      Allergies as of 05/08/2017      Reactions   Peanut-containing Drug Products Anaphylaxis   Pt states  her allergy is to [facility] nuts only-  She does eat other nuts      Medication List    TAKE these medications   B-D ASSURE BPM/AUTO ARM CUFF Misc 1 kit by Does not apply route once for 1 dose.   blood glucose meter kit and supplies Kit Dispense based on patient and insurance preference. Use up to four times daily as directed. (FOR ICD-9 250.00, 250.01).   carvedilol 12.5 MG tablet Commonly known as:  COREG Take 1 tablet (12.5 mg total) by  mouth 2 (two) times daily with a meal.   furosemide 40 MG tablet Commonly known as:  LASIX Take 1 tablet (40 mg total) by mouth daily.   lisinopril 20 MG tablet Commonly known as:  PRINIVIL,ZESTRIL Take 1 tablet (20 mg total) by mouth daily.      Follow-up Information    Martinique, Betty G, MD Follow up on 05/16/2017.   Specialty:  Family Medicine Why:  at 9 am; please try to keep your apt or call to reschedule. Contact information: [address] [address] [phone]            Time coordinating discharge: 35 min  Signed:  Geradine Girt   [facility] 05/08/2017, 2:52 PM

## 2017-05-08 NOTE — Progress Notes (Signed)
  Echocardiogram 2D Echocardiogram has been performed.  Leta JunglingCooper, Ayomide Zuleta M 05/08/2017, 10:17 AM

## 2017-05-08 NOTE — Progress Notes (Signed)
Heart Failure Navigator Consult Note  Presentation: per Dr Walker Kehr Robin Oliver is a 57 y.o. female, w hypertension, CHF (EF 10% reported by pt), apparently c/o dyspnea and edema. And weight gain.  Pt notes that she has been without her lasix for 7 days, and during that time her feet have been swelling and she has gained weight and her dyspnea was worse.  Pt denies fever, chills, cough, cp, palp, n/v, diarrhea, brbpr.  Pt was seen at urgent care and told to come to ED.   Past Medical History:  Diagnosis Date  . CHF (congestive heart failure) (HCC)   . Hypertension   . OSA (obstructive sleep apnea)     Social History   Socioeconomic History  . Marital status: Single    Spouse name: None  . Number of children: None  . Years of education: None  . Highest education level: None  Social Needs  . Financial resource strain: None  . Food insecurity - worry: None  . Food insecurity - inability: None  . Transportation needs - medical: None  . Transportation needs - non-medical: None  Occupational History  . None  Tobacco Use  . Smoking status: Never Smoker  . Smokeless tobacco: Never Used  Substance and Sexual Activity  . Alcohol use: No    Frequency: Never  . Drug use: No  . Sexual activity: None  Other Topics Concern  . None  Social History Narrative  . None    ECHO: pending  BNP    Component Value Date/Time   BNP 263.1 (H) 05/06/2017 1809    ProBNP No results found for: PROBNP   Education Assessment and Provision:  Detailed education and instructions provided on heart failure disease management including the following:  Signs and symptoms of Heart Failure When to call the physician Importance of daily weights Low sodium diet Fluid restriction Medication management Anticipated future follow-up appointments  Patient education given on each of the above topics.  Patient acknowledges understanding and acceptance of all instructions.  I spoke with Robin Oliver  regarding her HF and current hospitalization.  She is new to [facility] and previously got her care in [place]. She tells me that she has had HF "for years" and has received education before.  She does not have a scale and I reviewed the importance of daily weights.  She tells me that she plans to get a scale at the time of discharge.  I also reviewed when to contact the physician related to weight changes.  I reviewed a low sodium diet and high sodium foods to avoid.  She admits that she eats "on the go" often at fast food restaurants.  I discouraged fast food and we discussed alternatives (food prep).  She also says that she "ran out of medications" due to change in insurance at the beginning of the year.  She tells me that since being admitted to hospital her insurance has notified her that medications have been delivered.  She does not have established cardiology care because she is new to the area and wishes to follow with a female cardiologist.  She also refused my assistance with follow-up appt.  She says that she will schedule her own appt and wants to research providers.  Education Materials:  "Living Better With Heart Failure" Booklet, Daily Weight Tracker Tool    High Risk Criteria for Readmission and/or Poor Patient Outcomes:    EF <30%- pending  2 or more admissions in 6 months-No new to [facility]  Difficult social situation- denies  Demonstrates medication noncompliance- yes ran out of medications due to insurance change?    Barriers of Care:  Knowledge and compliance  Discharge Planning:   Pl[place].  She is quite active and rarely at home per her own report.

## 2017-05-08 NOTE — Plan of Care (Signed)
Nutrition Education Note  RD consulted for nutrition education regarding CHF and DM.  Lab Results  Component Value Date   HGBA1C 8.6 (H) 05/08/2017     Spoke with pt at bedside, who reported increased stress secondary to recent move and busy schedule. Pt reports that she often gone for long periods of time from home, secondary to her various church commitments. Pt reports that she [facility] [employer] [facility] [facility] often and tries to meal prep at home, but has not done this recently due to unpredictable schedule. She has been eating out often and has been relying on convenience foods or the local diner (will order either veggie omelet or biscuit with gravy). Pt reports that her biggest barrier is finding the time to meal prep (enjoys cooking salmon, avocado, and tuna salad sandwiches). The majority of the educations session was focused on healthier choices when eating out, meal prep ideas, and reinforcing importance of self-management for heart failure and DM to help pt reach her health goals.   RD provided "Heart Healthy, Consistent Carbohydrate Nutrition Therapy" handout from the Academy of [employer] and Dietetics. Reviewed patient's dietary recall. Provided examples on ways to decrease sodium intake in diet. Discouraged intake of processed foods and use of salt shaker. Encouraged fresh fruits and vegetables as well as whole grain sources of carbohydrates to maximize fiber intake.   RD discussed why it is important for patient to adhere to diet recommendations, and emphasized the role of fluids, foods to avoid, and importance of weighing self daily.   Discussed different food groups and their effects on blood sugar, emphasizing carbohydrate-containing foods. Provided list of carbohydrates and recommended serving sizes of common foods.  Discussed importance of controlled and consistent carbohydrate intake throughout the day. Provided examples of ways to balance meals/snacks and encouraged intake of high-fiber,  whole grain complex carbohydrates. Teach back method used.  Expect fair to good compliance.  Body mass index is 49.98 kg/m. Pt meets criteria for extreme obesity, class III based on current BMI.  Current diet order is Heart Healthy/ Carb Modified, patient is consuming approximately 100% of meals at this time. Labs and medications reviewed. No further nutrition interventions warranted at this time. RD contact information provided. If additional nutrition issues arise, please re-consult RD.   Aedyn Mckeon A. Mayford KnifeWilliams, RD, LDN, CDE Pager: [phone] After hours Pager: [phone]

## 2017-05-08 NOTE — Progress Notes (Signed)
Spoke with pt about A1C results with them and explained what an A1C is, basic pathophysiology of DM Type 2, basic home care, basic diabetes diet nutrition principles, importance of checking CBGs and maintaining good CBG control to prevent long-term and short-term complications. Reviewed signs and symptoms of hyperglycemia and hypoglycemia and how to treat hypoglycemia at home. Also reviewed blood sugar goals at home.  RNs to provide ongoing basic DM education at bedside with this patient. Have ordered educational booklet and DM videos. Have also placed RD consult for DM diet education for this patient.   Spoke to patient regarding nutrition and A1c. Patient states willingness to consider oral medications when she goes to her PCP. According to care everywhere patient's labs have been in diabetes range since 206 and patient states she thought she was "borderline". Explained range of diabetes and borderline.  Thank you, Robin FischerJudy E. [name], RN, MSN, CDE  Diabetes Coordinator Inpatient Glycemic Control Team Team Pager [phone] (8am-5pm) 05/08/2017 12:05 PM

## 2017-05-08 NOTE — Progress Notes (Signed)
Patient recently moved here and is asking for her records from [facility] to be [facility]. Also want to make sure she has a cardiologist. Patient shows interest in Heart Failure rehab program.

## 2017-05-16 ENCOUNTER — Encounter: Payer: Self-pay | Admitting: Family Medicine

## 2017-05-16 ENCOUNTER — Ambulatory Visit (INDEPENDENT_AMBULATORY_CARE_PROVIDER_SITE_OTHER): Payer: Medicare HMO | Admitting: Family Medicine

## 2017-05-16 VITALS — BP 132/80 | HR 82 | Temp 98.8°F | Resp 16 | Ht 64.0 in | Wt 295.4 lb

## 2017-05-16 DIAGNOSIS — G4733 Obstructive sleep apnea (adult) (pediatric): Secondary | ICD-10-CM | POA: Diagnosis not present

## 2017-05-16 DIAGNOSIS — I5033 Acute on chronic diastolic (congestive) heart failure: Secondary | ICD-10-CM

## 2017-05-16 DIAGNOSIS — E119 Type 2 diabetes mellitus without complications: Secondary | ICD-10-CM | POA: Diagnosis not present

## 2017-05-16 DIAGNOSIS — I110 Hypertensive heart disease with heart failure: Secondary | ICD-10-CM | POA: Diagnosis not present

## 2017-05-16 LAB — MICROALBUMIN / CREATININE URINE RATIO
CREATININE, U: 87.9 mg/dL
MICROALB UR: 1.8 mg/dL (ref 0.0–1.9)
MICROALB/CREAT RATIO: 2.1 mg/g (ref 0.0–30.0)

## 2017-05-16 LAB — BASIC METABOLIC PANEL
BUN: 21 mg/dL (ref 6–23)
CHLORIDE: 104 meq/L (ref 96–112)
CO2: 28 meq/L (ref 19–32)
Calcium: 9.4 mg/dL (ref 8.4–10.5)
Creatinine, Ser: 1.11 mg/dL (ref 0.40–1.20)
GFR: 65.36 mL/min (ref >60.00)
GLUCOSE: 220 mg/dL — AB (ref 70–99)
POTASSIUM: 4.2 meq/L (ref 3.5–5.1)
Sodium: 139 mEq/L (ref 135–145)

## 2017-05-16 LAB — BRAIN NATRIURETIC PEPTIDE: Pro B Natriuretic peptide (BNP): 83 pg/mL (ref 0.0–100.0)

## 2017-05-16 MED ORDER — LISINOPRIL 20 MG PO TABS: 20.0000 mg | ORAL_TABLET | Freq: Every day | ORAL | 0 refills | Status: DC

## 2017-05-16 MED ORDER — CARVEDILOL 12.5 MG PO TABS: 12.5000 mg | ORAL_TABLET | Freq: Two times a day (BID) | ORAL | 1 refills | Status: DC

## 2017-05-16 MED ORDER — LISINOPRIL 20 MG PO TABS: 20.0000 mg | ORAL_TABLET | Freq: Every day | ORAL | 1 refills | Status: DC

## 2017-05-16 MED ORDER — FUROSEMIDE 40 MG PO TABS: 40.0000 mg | ORAL_TABLET | Freq: Every day | ORAL | 1 refills | Status: DC

## 2017-05-16 NOTE — Patient Instructions (Addendum)
A few things to remember from today's visit:   Non-insulin dependent type 2 diabetes mellitus (HCC) - Plan: Microalbumin / creatinine urine ratio  Acute on chronic diastolic CHF (congestive heart failure) (HCC) - Plan: carvedilol (COREG) 12.5 MG tablet, furosemide (LASIX) 40 MG tablet, Basic metabolic panel, Brain Natriuretic Peptide, lisinopril (PRINIVIL,ZESTRIL) 20 MG tablet, DISCONTINUED: lisinopril (PRINIVIL,ZESTRIL) 20 MG tablet, DISCONTINUED: lisinopril (PRINIVIL,ZESTRIL) 20 MG tablet  Hypertensive heart disease with heart failure (HCC) - Plan: carvedilol (COREG) 12.5 MG tablet, lisinopril (PRINIVIL,ZESTRIL) 20 MG tablet, DISCONTINUED: lisinopril (PRINIVIL,ZESTRIL) 20 MG tablet, DISCONTINUED: lisinopril (PRINIVIL,ZESTRIL) 20 MG tablet  Options for diabetes: Metformin,Victoza,Jardiance, and Lantus.  HgA1C goal < 7.0. Avoid sugar added food:regular soft drinks, energy drinks, and sports drinks. candy. cakes. cookies. pies and cobblers. sweet rolls, pastries, and donuts. fruit drinks, such as fruitades and fruit punch. dairy desserts, such as ice cream  Mediterranean diet has showed benefits for sugar control.  How much and what type of carbohydrate foods are important for managing diabetes. The balance between how much insulin is in your body and the carbohydrate you eat makes a difference in your blood glucose levels.  Fasting blood sugar ideally 130 or less, 2 hours after meals less than 180.   Regular exercise also will help with controlling disease, daily brisk walking as tolerated for 15-30 min definitively will help.   Avoid skipping meals, blood sugar might drop and cause serious problems. Remember checking feet periodically, good dental hygiene, and annual eye exam.     Please be sure medication list is accurate. If a new problem present, please set up appointment sooner than planned today.

## 2017-05-16 NOTE — Assessment & Plan Note (Signed)
Poorly controlled. She is not interested in pharmacologic treatment, educated about complications of DM.A list of treatment options given.  Regular exercise and healthy diet with avoidance of added sugar food intake is an important part of treatment and recommended. Annual eye exam, periodic dental and foot care recommended. F/U in 4 weeks.

## 2017-05-16 NOTE — Assessment & Plan Note (Signed)
She wants to review cardiologists in the area before referral is placed. Instructed to wight daily. Fluid restriction and low salt diet. No changes in current management.

## 2017-05-16 NOTE — Progress Notes (Signed)
HPI:   Robin Oliver is a 57 y.o. female, who is here today to establish care.  Former PCP: N/A Last preventive routine visit: 1-2 years ago.  She moved 1-2 months ago to the area.  Chronic medical problems: CHF,HTN,DM II,and obesity.  HTN: Dx in 2012.  She is currently on Lisinopril 20 mg and Coreg 12.5 mg bid. Denies severe/frequent headache, visual changes, chest pain, worsening dyspnea, palpitation, claudication, focal weakness, or worsening edema.  CHF: Sleeping on her couch ,cannot sleep flat in bed,stable. Denies PND. She is not weighing daily. She is on Furosemide 40 mg daily,it has helped with LE edema.   Diabetes Mellitus II:   Dx around 2016 Currently on non pharmacologic treatment..  Checking BS's : not checking.    She denies abdominal pain, nausea, vomiting, polydipsia, polyuria, or polyphagia. No numbness, tingling, or burning.  She exercises regularly,swims a few times per week.    Lab Results  Component Value Date   CREATININE 1.08 (H) 05/08/2017   BUN 17 05/08/2017   NA 140 05/08/2017   K 3.7 05/08/2017   CL 106 05/08/2017   CO2 24 05/08/2017    Lab Results  Component Value Date   HGBA1C 8.6 (H) 05/08/2017    Concerns today: She needs referral.  Review of Systems  Constitutional: Positive for fatigue. Negative for activity change, appetite change and fever.  HENT: Negative for mouth sores, nosebleeds and trouble swallowing.   Eyes: Negative for redness and visual disturbance.  Respiratory: Negative for cough, shortness of breath and wheezing.   Cardiovascular: Negative for chest pain, palpitations and leg swelling.  Gastrointestinal: Negative for abdominal pain, nausea and vomiting.       Negative for changes in bowel habits.  Endocrine: Negative for cold intolerance, heat intolerance, polydipsia, polyphagia and polyuria.  Genitourinary: Negative for decreased urine volume, difficulty urinating, dysuria and hematuria.    Musculoskeletal: Negative for gait problem and myalgias.  Neurological: Negative for syncope, weakness, numbness and headaches.  Psychiatric/Behavioral: Positive for sleep disturbance (sleeps about 3-4 hours). Negative for confusion. The patient is nervous/anxious.       Current Outpatient Medications on File Prior to Visit  Medication Sig Dispense Refill  . blood glucose meter kit and supplies KIT Dispense based on patient and insurance preference. Use up to four times daily as directed. (FOR ICD-9 250.00, 250.01). 1 each 0  . carvedilol (COREG) 12.5 MG tablet Take 1 tablet (12.5 mg total) by mouth 2 (two) times daily with a meal. 60 tablet 0  . furosemide (LASIX) 40 MG tablet Take 1 tablet (40 mg total) by mouth daily. 30 tablet 0  . lisinopril (PRINIVIL,ZESTRIL) 20 MG tablet Take 1 tablet (20 mg total) by mouth daily. 30 tablet 0   No current facility-administered medications on file prior to visit.      Past Medical History:  Diagnosis Date  . CHF (congestive heart failure) ([place])   . Hypertension   . OSA (obstructive sleep apnea)    Allergies  Allergen Reactions  . Peanut-Containing Drug Products Anaphylaxis    Pt states her allergy is to [facility] nuts only-  She does eat other nuts    Family History  Problem Relation Age of Onset  . Congestive Heart Failure Mother     Social History   Socioeconomic History  . Marital status: Single    Spouse name: None  . Number of children: None  . Years of education: None  . Highest education level:  None  Social Needs  . Financial resource strain: None  . Food insecurity - worry: None  . Food insecurity - inability: None  . Transportation needs - medical: None  . Transportation needs - non-medical: None  Occupational History  . None  Tobacco Use  . Smoking status: Never Smoker  . Smokeless tobacco: Never Used  Substance and Sexual Activity  . Alcohol use: No    Frequency: Never  . Drug use: No  . Sexual activity: No   Other Topics Concern  . None  Social History Narrative  . None    Vitals:   05/16/17 0850  BP: 132/80  Pulse: 82  Temp: 98.8 F (37.1 C)  SpO2: 96%    Body mass index is 50.7 kg/m.   Physical Exam  Nursing note and vitals reviewed. Constitutional: She is oriented to person, place, and time. She appears well-developed. No distress.  HENT:  Head: Normocephalic and atraumatic.  Mouth/Throat: Oropharynx is clear and moist and mucous membranes are normal.  Eyes: Conjunctivae and EOM are normal. Pupils are equal, round, and reactive to light.  Neck: No JVD present. No tracheal deviation present. No thyroid mass and no thyromegaly present.  Cardiovascular: Normal rate and regular rhythm.  No murmur heard. Pulses:      Dorsalis pedis pulses are 2+ on the right side, and 2+ on the left side.  Respiratory: Effort normal and breath sounds normal. No respiratory distress.  GI: Soft. She exhibits no mass. There is no hepatomegaly. There is no tenderness.  Musculoskeletal: She exhibits edema (Trace pitting LE edema,bilateral.). She exhibits no tenderness.  Lymphadenopathy:    She has no cervical adenopathy.  Neurological: She is alert and oriented to person, place, and time. She has normal strength. Coordination normal.  Skin: Skin is warm. No rash noted. No erythema.  Psychiatric: She has a normal mood and affect.  Well groomed, good eye contact.   Diabetic Foot Exam - Simple   Simple Foot Form Diabetic Foot exam was performed with the following findings:  Yes 05/16/2017  9:51 AM  Visual Inspection No deformities, no ulcerations, no other skin breakdown bilaterally:  Yes Sensation Testing Intact to touch and monofilament testing bilaterally:  Yes Pulse Check Posterior Tibialis and Dorsalis pulse intact bilaterally:  Yes Comments       ASSESSMENT AND PLAN:    Robin Oliver was seen today for establish care.  Diagnoses and all orders for this visit:  Orders Placed This  Encounter  Procedures  . Microalbumin / creatinine urine ratio  . Basic metabolic panel  . Brain Natriuretic Peptide   Lab Results  Component Value Date   CREATININE 1.11 05/16/2017   BUN 21 05/16/2017   NA 139 05/16/2017   K 4.2 05/16/2017   CL 104 05/16/2017   CO2 28 05/16/2017   Lab Results  Component Value Date   MICROALBUR 1.8 05/16/2017    Hypertensive heart disease with heart failure (HCC)  Adequately controlled. No changes in current management. DASH low salt diet recommended. Eye exam recommended annually.  -     carvedilol (COREG) 12.5 MG tablet; Take 1 tablet (12.5 mg total) by mouth 2 (two) times daily with a meal.  -     lisinopril (PRINIVIL,ZESTRIL) 20 MG tablet; Take 1 tablet (20 mg total) by mouth daily.  OSA (obstructive sleep apnea)  She is not wearing CPAP. Educated about adverse effects of OSA. She does not want referral place yet until she checks with insurance and make  some research about providers in the area.  Non-insulin dependent type 2 diabetes mellitus ([place]) Poorly controlled. She is not interested in pharmacologic treatment, educated about complications of DM.A list of treatment options given.  Regular exercise and healthy diet with avoidance of added sugar food intake is an important part of treatment and recommended. Annual eye exam, periodic dental and foot care recommended. F/U in 4 weeks.   Acute on chronic diastolic CHF (congestive heart failure) ([place]) She wants to review cardiologists in the area before referral is placed. Instructed to wight daily. Fluid restriction and low salt diet. No changes in current management.     Dierdre Mccalip G. Martinique, MD  [facility]. [place] office.

## 2017-07-09 ENCOUNTER — Telehealth: Payer: Self-pay

## 2017-07-09 NOTE — Telephone Encounter (Signed)
Pt called [facility] 07/07/17 with c/o congestion and productive cough with yellow, thick mucus. She denied any wheeze but does report ShOB. She denies any fever. She takes Loratadine for allergies.   Pt was advised by RN @ [facility] to be seen within 24 hours. Pt was not seen at Saturday clinic.  LMTCB to triage/schedule

## 2017-07-10 NOTE — Telephone Encounter (Signed)
Spoke with patient she stated that she has been trying to call office for 1 month and couldn't get through. Patient stated she was on her way to work and that she would call after 2 pm to schedule an appointment.

## 2017-07-11 ENCOUNTER — Inpatient Hospital Stay (HOSPITAL_COMMUNITY)
Admission: EM | Admit: 2017-07-11 | Discharge: 2017-07-16 | DRG: 291 | Disposition: A | Discharge status: Home / Self Care | Payer: Medicare HMO | Attending: Internal Medicine | Admitting: Internal Medicine

## 2017-07-11 ENCOUNTER — Encounter (HOSPITAL_COMMUNITY): Payer: Self-pay | Admitting: *Deleted

## 2017-07-11 ENCOUNTER — Other Ambulatory Visit: Payer: Self-pay

## 2017-07-11 ENCOUNTER — Emergency Department (HOSPITAL_COMMUNITY): Payer: Medicare HMO

## 2017-07-11 DIAGNOSIS — D751 Secondary polycythemia: Secondary | ICD-10-CM | POA: Diagnosis present

## 2017-07-11 DIAGNOSIS — R0602 Shortness of breath: Secondary | ICD-10-CM | POA: Diagnosis not present

## 2017-07-11 DIAGNOSIS — Z8249 Family history of ischemic heart disease and other diseases of the circulatory system: Secondary | ICD-10-CM

## 2017-07-11 DIAGNOSIS — I110 Hypertensive heart disease with heart failure: Principal | ICD-10-CM | POA: Diagnosis present

## 2017-07-11 DIAGNOSIS — R001 Bradycardia, unspecified: Secondary | ICD-10-CM | POA: Diagnosis present

## 2017-07-11 DIAGNOSIS — Z9109 Other allergy status, other than to drugs and biological substances: Secondary | ICD-10-CM

## 2017-07-11 DIAGNOSIS — J189 Pneumonia, unspecified organism: Secondary | ICD-10-CM | POA: Diagnosis present

## 2017-07-11 DIAGNOSIS — Z7982 Long term (current) use of aspirin: Secondary | ICD-10-CM

## 2017-07-11 DIAGNOSIS — T501X5A Adverse effect of loop [high-ceiling] diuretics, initial encounter: Secondary | ICD-10-CM | POA: Diagnosis present

## 2017-07-11 DIAGNOSIS — J9601 Acute respiratory failure with hypoxia: Secondary | ICD-10-CM | POA: Diagnosis present

## 2017-07-11 DIAGNOSIS — Z91010 Allergy to peanuts: Secondary | ICD-10-CM

## 2017-07-11 DIAGNOSIS — IMO0002 Reserved for concepts with insufficient information to code with codable children: Secondary | ICD-10-CM

## 2017-07-11 DIAGNOSIS — I509 Heart failure, unspecified: Secondary | ICD-10-CM

## 2017-07-11 DIAGNOSIS — E876 Hypokalemia: Secondary | ICD-10-CM | POA: Diagnosis present

## 2017-07-11 DIAGNOSIS — G4733 Obstructive sleep apnea (adult) (pediatric): Secondary | ICD-10-CM | POA: Diagnosis present

## 2017-07-11 DIAGNOSIS — Z6841 Body Mass Index (BMI) 40.0 and over, adult: Secondary | ICD-10-CM

## 2017-07-11 DIAGNOSIS — J810 Acute pulmonary edema: Secondary | ICD-10-CM

## 2017-07-11 DIAGNOSIS — Z79899 Other long term (current) drug therapy: Secondary | ICD-10-CM

## 2017-07-11 DIAGNOSIS — I10 Essential (primary) hypertension: Secondary | ICD-10-CM | POA: Diagnosis present

## 2017-07-11 DIAGNOSIS — I5033 Acute on chronic diastolic (congestive) heart failure: Secondary | ICD-10-CM | POA: Diagnosis present

## 2017-07-11 DIAGNOSIS — E1165 Type 2 diabetes mellitus with hyperglycemia: Secondary | ICD-10-CM | POA: Diagnosis present

## 2017-07-11 LAB — BASIC METABOLIC PANEL
Anion gap: 17 — ABNORMAL HIGH (ref 5–15)
BUN: 17 mg/dL (ref 6–20)
CALCIUM: 9.6 mg/dL (ref 8.9–10.3)
CHLORIDE: 102 mmol/L (ref 101–111)
CO2: 18 mmol/L — AB (ref 22–32)
CREATININE: 1.32 mg/dL — AB (ref 0.44–1.00)
GFR calc non Af Amer: 44 mL/min — ABNORMAL LOW (ref >60)
GFR, EST AFRICAN AMERICAN: 51 mL/min — AB (ref >60)
Glucose, Bld: 206 mg/dL — ABNORMAL HIGH (ref 65–99)
Potassium: 4.4 mmol/L (ref 3.5–5.1)
Sodium: 137 mmol/L (ref 135–145)

## 2017-07-11 LAB — CBC WITH DIFFERENTIAL/PLATELET
BASOS PCT: 0 %
Basophils Absolute: 0 10*3/uL (ref 0.0–0.1)
EOS ABS: 0.4 10*3/uL (ref 0.0–0.7)
EOS PCT: 2 %
HCT: 54.3 % — ABNORMAL HIGH (ref 36.0–46.0)
Hemoglobin: 17.6 g/dL — ABNORMAL HIGH (ref 12.0–15.0)
Lymphocytes Relative: 38 %
Lymphs Abs: 7.1 10*3/uL — ABNORMAL HIGH (ref 0.7–4.0)
MCH: 27 pg (ref 26.0–34.0)
MCHC: 32.4 g/dL (ref 30.0–36.0)
MCV: 83.3 fL (ref 78.0–100.0)
MONO ABS: 1.3 10*3/uL — AB (ref 0.1–1.0)
Monocytes Relative: 7 %
Neutro Abs: 9.8 10*3/uL — ABNORMAL HIGH (ref 1.7–7.7)
Neutrophils Relative %: 53 %
PLATELETS: 256 10*3/uL (ref 150–400)
RBC: 6.52 MIL/uL — ABNORMAL HIGH (ref 3.87–5.11)
RDW: 16.3 % — ABNORMAL HIGH (ref 11.5–15.5)
WBC: 18.6 10*3/uL — ABNORMAL HIGH (ref 4.0–10.5)

## 2017-07-11 LAB — I-STAT TROPONIN, ED: Troponin i, poc: 0.04 ng/mL (ref 0.00–0.08)

## 2017-07-11 LAB — BRAIN NATRIURETIC PEPTIDE: B NATRIURETIC PEPTIDE 5: 193.2 pg/mL — AB (ref 0.0–100.0)

## 2017-07-11 MED ORDER — NITROGLYCERIN IN D5W 200-5 MCG/ML-% IV SOLN
0.0000 ug/min | INTRAVENOUS | Status: DC
  Administered 2017-07-11: 20 ug/min via INTRAVENOUS

## 2017-07-11 MED ORDER — FUROSEMIDE 10 MG/ML IJ SOLN
80.0000 mg | Freq: Once | INTRAMUSCULAR | Status: AC
  Administered 2017-07-11: 80 mg via INTRAVENOUS
  Filled 2017-07-11: qty 8

## 2017-07-11 MED ORDER — NITROGLYCERIN IN D5W 200-5 MCG/ML-% IV SOLN
INTRAVENOUS | Status: AC
  Filled 2017-07-11: qty 250

## 2017-07-11 NOTE — ED Notes (Signed)
The pt feels better.   

## 2017-07-11 NOTE — ED Notes (Signed)
The pt is c/o a dry mouth she wants something to drink and wants the bi-pap off resp therapy at the bedside  Taking bi-pap off to get her some relief

## 2017-07-11 NOTE — ED Provider Notes (Signed)
[facility] EMERGENCY DEPARTMENT Provider Note   CSN: [id number] Arrival date & time: 07/11/17  2110     History   Chief Complaint Chief Complaint  Patient presents with  . Respiratory Distress    HPI Robin Oliver is a 57 y.o. female.  Level 5 caveat secondary to acuity of condition.  Patient with history of CHF became more short of breath acutely tonight.  She drove herself here but was in extremitas by the time we got her into the emergency department.  She was minimally able to speak but was awake.  She was tachypneic.  She denied any chest pain.  She was emergently placed on BiPAP and is seemingly more comfortable. HPI  Past Medical History:  Diagnosis Date  . Allergy   . CHF (congestive heart failure) ([place])   . CHF (congestive heart failure) (HCC)    heart arrhythmia  . Hypertension   . OSA (obstructive sleep apnea)     Patient Active Problem List   Diagnosis Date Noted  . Hypertensive heart disease with heart failure ([place]) 05/16/2017  . OSA (obstructive sleep apnea) 05/16/2017  . Acute on chronic diastolic CHF (congestive heart failure) ([place]) 05/08/2017  . Obesity, Class III, BMI 40-49.9 (morbid obesity) ([place]) 05/08/2017  . Non-insulin dependent type 2 diabetes mellitus ([place]) 05/08/2017  . CHF (congestive heart failure) ([place]) 05/06/2017    History reviewed. No pertinent surgical history.   OB History   None      Home Medications    Prior to Admission medications   Medication Sig Start Date End Date Taking? Authorizing Provider  aspirin EC 81 MG tablet Take by mouth.    [provider]  blood glucose meter kit and supplies KIT Dispense based on patient and insurance preference. Use up to four times daily as directed. (FOR ICD-9 250.00, 250.01). 05/08/17   Geradine Girt, DO  carvedilol (COREG) 12.5 MG tablet Take 1 tablet (12.5 mg total) by mouth 2 (two) times daily with a meal. 05/16/17   Martinique, Betty G, MD  furosemide (LASIX)  40 MG tablet Take 1 tablet (40 mg total) by mouth daily. 05/16/17   Martinique, Betty G, MD  lisinopril (PRINIVIL,ZESTRIL) 20 MG tablet Take 1 tablet (20 mg total) by mouth daily. 05/16/17   Martinique, Betty G, MD    Family History Family History  Problem Relation Age of Onset  . Congestive Heart Failure Mother   . Heart failure Mother   . Congestive Heart Failure Sister   . Congestive Heart Failure Brother     Social History Social History   Tobacco Use  . Smoking status: Never Smoker  . Smokeless tobacco: Never Used  Substance Use Topics  . Alcohol use: No    Frequency: Never  . Drug use: No     Allergies   Peanut-containing drug products and [place]   Review of Systems Review of Systems  Unable to perform ROS: Acuity of condition     Physical Exam Updated Vital Signs BP (!) 132/112   Pulse (!) 121   Temp 98 F (36.7 C) (Axillary)   Resp (!) 36   Ht 5' 5"  (1.651 m)   Wt 133.8 kg (295 lb)   SpO2 90%   BMI 49.09 kg/m   Physical Exam  Constitutional: She appears well-developed and well-nourished. She appears distressed.  HENT:  Head: Normocephalic and atraumatic.  Right Ear: External ear normal.  Left Ear: External ear normal.  Nose: Nose normal.  Mouth/Throat: Oropharynx is clear and moist.  Eyes: Pupils are equal, round, and reactive to light. Conjunctivae are normal.  Neck: Normal range of motion. No tracheal deviation present.  Cardiovascular: Normal pulses. Tachycardia present.  Pulmonary/Chest: Accessory muscle usage present. Tachypnea noted. She is in respiratory distress. She has rales (throughout).  Abdominal: Soft. She exhibits no mass. There is no tenderness. There is no guarding.  Musculoskeletal: Normal range of motion. She exhibits edema. She exhibits no tenderness.  Neurological: She is alert. She has normal strength. No cranial nerve deficit. GCS eye subscore is 4. GCS verbal subscore is 5. GCS motor subscore is 6.  Skin: Capillary refill takes less  than 2 seconds. No rash noted. She is diaphoretic.     ED Treatments / Results  Labs (all labs ordered are listed, but only abnormal results are displayed) Labs Reviewed  BASIC METABOLIC PANEL - Abnormal; Notable for the following components:      Result Value   CO2 18 (*)    Glucose, Bld 206 (*)    Creatinine, Ser 1.32 (*)    GFR calc non Af Amer 44 (*)    GFR calc Af Amer 51 (*)    Anion gap 17 (*)    All other components within normal limits  CBC WITH DIFFERENTIAL/PLATELET - Abnormal; Notable for the following components:   WBC 18.6 (*)    RBC 6.52 (*)    Hemoglobin 17.6 (*)    HCT 54.3 (*)    RDW 16.3 (*)    Neutro Abs 9.8 (*)    Lymphs Abs 7.1 (*)    Monocytes Absolute 1.3 (*)    All other components within normal limits  BRAIN NATRIURETIC PEPTIDE - Abnormal; Notable for the following components:   B Natriuretic Peptide 193.2 (*)    All other components within normal limits  CBC - Abnormal; Notable for the following components:   WBC 17.6 (*)    RBC 5.81 (*)    Hemoglobin 15.8 (*)    HCT 48.7 (*)    RDW 16.5 (*)    All other components within normal limits  TROPONIN I - Abnormal; Notable for the following components:   Troponin I 0.03 (*)    All other components within normal limits  BASIC METABOLIC PANEL - Abnormal; Notable for the following components:   Glucose, Bld 154 (*)    Creatinine, Ser 1.22 (*)    GFR calc non Af Amer 49 (*)    GFR calc Af Amer 56 (*)    All other components within normal limits  GLUCOSE, CAPILLARY - Abnormal; Notable for the following components:   Glucose-Capillary 140 (*)    All other components within normal limits  GLUCOSE, CAPILLARY - Abnormal; Notable for the following components:   Glucose-Capillary 176 (*)    All other components within normal limits  GLUCOSE, CAPILLARY - Abnormal; Notable for the following components:   Glucose-Capillary 171 (*)    All other components within normal limits  GLUCOSE, CAPILLARY - Abnormal;  Notable for the following components:   Glucose-Capillary 244 (*)    All other components within normal limits  CBG MONITORING, ED - Abnormal; Notable for the following components:   Glucose-Capillary 262 (*)    All other components within normal limits  CBG MONITORING, ED - Abnormal; Notable for the following components:   Glucose-Capillary 162 (*)    All other components within normal limits  CBG MONITORING, ED - Abnormal; Notable for the following components:  Glucose-Capillary 125 (*)    All other components within normal limits  MRSA PCR SCREENING  TSH  TROPONIN I  MAGNESIUM  PROCALCITONIN  PATHOLOGIST SMEAR REVIEW  BASIC METABOLIC PANEL  I-STAT TROPONIN, ED    EKG EKG Interpretation  Date/Time:  Wednesday July 11 2017 21:19:11 EDT Ventricular Rate:  124 PR Interval:    QRS Duration: 88 QT Interval:  312 QTC Calculation: 449 R Axis:   9 Text Interpretation:  Sinus tachycardia Aberrant conduction of SV complex(es) LAE, consider biatrial enlargement Low voltage, precordial leads Baseline wander in lead(s) V1 V2 V3 nonspecific st/ts new from 1/19 Confirmed by Aletta Edouard [id number]) on 07/11/2017 9:27:19 PM   Radiology Dg Chest Port 1 View  Result Date: 07/11/2017 CLINICAL DATA:  Respiratory distress today. EXAM: PORTABLE CHEST 1 VIEW COMPARISON:  Chest radiograph May 06, 2017 FINDINGS: Similar cardiomegaly. Diffuse interstitial and alveolar airspace opacities new from prior examination. Small probable pleural effusions. No pneumothorax. Soft tissue planes and included osseous structures are unchanged. IMPRESSION: New interstitial alveolar airspace opacities seen with CHF and/or pneumonia. Small probable pleural effusions. Stable cardiomegaly. Electronically Signed   By: Elon Alas M.D.   On: 07/11/2017 21:51    Procedures .Critical Care Performed by: Hayden Rasmussen, MD Authorized by: Hayden Rasmussen, MD   Critical care provider statement:    Critical  care time (minutes):  45   Critical care time was exclusive of:  Separately billable procedures and treating other patients   Critical care was necessary to treat or prevent imminent or life-threatening deterioration of the following conditions:  Cardiac failure and respiratory failure   Critical care was time spent personally by me on the following activities:  Development of treatment plan with patient or surrogate, discussions with consultants, evaluation of patient's response to treatment, examination of patient, obtaining history from patient or surrogate, ordering and performing treatments and interventions, ordering and review of laboratory studies, ordering and review of radiographic studies, pulse oximetry, re-evaluation of patient's condition and review of old charts   I assumed direction of critical care for this patient from another provider in my specialty: no   Comments:     Patient was critically ill on arrival and it appeared that she may actually code had not interventions happened immediately.  She was quickly placed on BiPAP and nitroglycerin drip which stabilized her fluid overload.  I reevaluated her multiple times to make sure that she continued to improve.   (including critical care time)  Medications Ordered in ED Medications  nitroGLYCERIN 50 mg in dextrose 5 % 250 mL (0.2 mg/mL) infusion (20 mcg/min Intravenous New Bag/Given 07/11/17 2124)  nitroGLYCERIN 0.2 mg/mL in dextrose 5 % infusion (has no administration in time range)  furosemide (LASIX) injection 80 mg (80 mg Intravenous Given 07/11/17 2132)     Initial Impression / Assessment and Plan / ED Course  I have reviewed the triage vital signs and the nursing notes.  Pertinent labs & imaging results that were available during my care of the patient were reviewed by me and considered in my medical decision making (see chart for details).  Clinical Course as of Jul 14 1047  Wed Jul 11, 2017  2214 Reevaluated patient.   She is breathing more comfortably and is tolerating BiPAP well.   [MB]  2236 Reevalauted patient. Improved BP and air movement. Sounds less wet. She is asking for time off bipap to drink as is very dry.    [MB]  2244 Discussed with  tried hospitalist Dr. Hal Hope who will evaluate the patient in the ED.   [MB]  2336 Evaluated patient.  She would like to try off BiPAP.  BiPAP taken off and she is on Ventimask.  We will continue to observe.  Dr. Hiram Comber candy and to see patient.   [MB]  Thu Jul 12, 2017  0007 Patient began to desat have some increased work of breathing so she is back on BiPAP.   [MB]    Clinical Course User Index [MB] Hayden Rasmussen, MD     Final Clinical Impressions(s) / ED Diagnoses   Final diagnoses:  Acute pulmonary edema ([place])  Acute on chronic congestive heart failure, unspecified heart failure type [facility])    ED Discharge Orders    None       Hayden Rasmussen, MD 07/13/17 1052

## 2017-07-11 NOTE — ED Notes (Signed)
Pt keeps asking for water and  She reports dry mouoth  And asks to be off bi-pap to spit frequently

## 2017-07-11 NOTE — ED Triage Notes (Signed)
The pt arrived by car  C/o difficulty breathing tonight   Alert on arrival with respiratory distress  Skin cool and dry

## 2017-07-11 NOTE — ED Notes (Signed)
The pt drove herself here in respiratory distress

## 2017-07-12 ENCOUNTER — Other Ambulatory Visit: Payer: Self-pay

## 2017-07-12 ENCOUNTER — Encounter (HOSPITAL_COMMUNITY): Payer: Self-pay | Admitting: Internal Medicine

## 2017-07-12 DIAGNOSIS — R0602 Shortness of breath: Secondary | ICD-10-CM | POA: Diagnosis present

## 2017-07-12 DIAGNOSIS — E1169 Type 2 diabetes mellitus with other specified complication: Secondary | ICD-10-CM

## 2017-07-12 DIAGNOSIS — J189 Pneumonia, unspecified organism: Secondary | ICD-10-CM | POA: Diagnosis present

## 2017-07-12 DIAGNOSIS — J9601 Acute respiratory failure with hypoxia: Secondary | ICD-10-CM | POA: Diagnosis present

## 2017-07-12 DIAGNOSIS — Z9109 Other allergy status, other than to drugs and biological substances: Secondary | ICD-10-CM | POA: Diagnosis not present

## 2017-07-12 DIAGNOSIS — G4733 Obstructive sleep apnea (adult) (pediatric): Secondary | ICD-10-CM | POA: Diagnosis present

## 2017-07-12 DIAGNOSIS — R001 Bradycardia, unspecified: Secondary | ICD-10-CM | POA: Diagnosis present

## 2017-07-12 DIAGNOSIS — E669 Obesity, unspecified: Secondary | ICD-10-CM

## 2017-07-12 DIAGNOSIS — I5033 Acute on chronic diastolic (congestive) heart failure: Secondary | ICD-10-CM

## 2017-07-12 DIAGNOSIS — T501X5A Adverse effect of loop [high-ceiling] diuretics, initial encounter: Secondary | ICD-10-CM | POA: Diagnosis present

## 2017-07-12 DIAGNOSIS — Z7982 Long term (current) use of aspirin: Secondary | ICD-10-CM | POA: Diagnosis not present

## 2017-07-12 DIAGNOSIS — I110 Hypertensive heart disease with heart failure: Secondary | ICD-10-CM | POA: Diagnosis present

## 2017-07-12 DIAGNOSIS — I10 Essential (primary) hypertension: Secondary | ICD-10-CM | POA: Diagnosis not present

## 2017-07-12 DIAGNOSIS — E876 Hypokalemia: Secondary | ICD-10-CM | POA: Diagnosis present

## 2017-07-12 DIAGNOSIS — Z6841 Body Mass Index (BMI) 40.0 and over, adult: Secondary | ICD-10-CM | POA: Diagnosis not present

## 2017-07-12 DIAGNOSIS — Z79899 Other long term (current) drug therapy: Secondary | ICD-10-CM | POA: Diagnosis not present

## 2017-07-12 DIAGNOSIS — I509 Heart failure, unspecified: Secondary | ICD-10-CM | POA: Diagnosis not present

## 2017-07-12 DIAGNOSIS — E1165 Type 2 diabetes mellitus with hyperglycemia: Secondary | ICD-10-CM | POA: Diagnosis present

## 2017-07-12 DIAGNOSIS — D751 Secondary polycythemia: Secondary | ICD-10-CM | POA: Diagnosis present

## 2017-07-12 DIAGNOSIS — J810 Acute pulmonary edema: Secondary | ICD-10-CM | POA: Diagnosis not present

## 2017-07-12 DIAGNOSIS — Z91010 Allergy to peanuts: Secondary | ICD-10-CM | POA: Diagnosis not present

## 2017-07-12 DIAGNOSIS — Z8249 Family history of ischemic heart disease and other diseases of the circulatory system: Secondary | ICD-10-CM | POA: Diagnosis not present

## 2017-07-12 LAB — CBC
HCT: 48.7 % — ABNORMAL HIGH (ref 36.0–46.0)
Hemoglobin: 15.8 g/dL — ABNORMAL HIGH (ref 12.0–15.0)
MCH: 27.2 pg (ref 26.0–34.0)
MCHC: 32.4 g/dL (ref 30.0–36.0)
MCV: 83.8 fL (ref 78.0–100.0)
PLATELETS: 225 10*3/uL (ref 150–400)
RBC: 5.81 MIL/uL — ABNORMAL HIGH (ref 3.87–5.11)
RDW: 16.5 % — ABNORMAL HIGH (ref 11.5–15.5)
WBC: 17.6 10*3/uL — AB (ref 4.0–10.5)

## 2017-07-12 LAB — BASIC METABOLIC PANEL
Anion gap: 14 (ref 5–15)
BUN: 17 mg/dL (ref 6–20)
CHLORIDE: 101 mmol/L (ref 101–111)
CO2: 25 mmol/L (ref 22–32)
CREATININE: 1.22 mg/dL — AB (ref 0.44–1.00)
Calcium: 9.2 mg/dL (ref 8.9–10.3)
GFR calc Af Amer: 56 mL/min — ABNORMAL LOW (ref >60)
GFR calc non Af Amer: 49 mL/min — ABNORMAL LOW (ref >60)
Glucose, Bld: 154 mg/dL — ABNORMAL HIGH (ref 65–99)
POTASSIUM: 3.8 mmol/L (ref 3.5–5.1)
Sodium: 140 mmol/L (ref 135–145)

## 2017-07-12 LAB — CBG MONITORING, ED
GLUCOSE-CAPILLARY: 162 mg/dL — AB (ref 65–99)
GLUCOSE-CAPILLARY: 125 mg/dL — AB (ref 65–99)
Glucose-Capillary: 262 mg/dL — ABNORMAL HIGH (ref 65–99)

## 2017-07-12 LAB — MRSA PCR SCREENING: MRSA BY PCR: NEGATIVE

## 2017-07-12 LAB — GLUCOSE, CAPILLARY
GLUCOSE-CAPILLARY: 140 mg/dL — AB (ref 65–99)
GLUCOSE-CAPILLARY: 176 mg/dL — AB (ref 65–99)

## 2017-07-12 LAB — MAGNESIUM: Magnesium: 2 mg/dL (ref 1.7–2.4)

## 2017-07-12 LAB — TROPONIN I
Troponin I: 0.03 ng/mL (ref <0.03)
Troponin I: <0.03 ng/mL (ref <0.03)

## 2017-07-12 LAB — TSH: TSH: 0.848 u[IU]/mL (ref 0.350–4.500)

## 2017-07-12 LAB — PATHOLOGIST SMEAR REVIEW: PATH REVIEW: REACTIVE

## 2017-07-12 LAB — PROCALCITONIN: Procalcitonin: 0.24 ng/mL

## 2017-07-12 MED ORDER — ENOXAPARIN SODIUM 40 MG/0.4ML ~~LOC~~ SOLN
40.0000 mg | Freq: Every day | SUBCUTANEOUS | Status: DC
  Administered 2017-07-12 – 2017-07-15 (×4): 40 mg via SUBCUTANEOUS
  Filled 2017-07-12 (×4): qty 0.4

## 2017-07-12 MED ORDER — FUROSEMIDE 10 MG/ML IJ SOLN
60.0000 mg | Freq: Two times a day (BID) | INTRAMUSCULAR | Status: DC
  Administered 2017-07-12 – 2017-07-16 (×9): 60 mg via INTRAVENOUS
  Filled 2017-07-12 (×10): qty 6

## 2017-07-12 MED ORDER — CARVEDILOL 12.5 MG PO TABS
12.5000 mg | ORAL_TABLET | Freq: Two times a day (BID) | ORAL | Status: DC
  Administered 2017-07-12 – 2017-07-16 (×9): 12.5 mg via ORAL
  Filled 2017-07-12 (×9): qty 1

## 2017-07-12 MED ORDER — ASPIRIN EC 81 MG PO TBEC
81.0000 mg | DELAYED_RELEASE_TABLET | Freq: Every day | ORAL | Status: DC
  Administered 2017-07-12 – 2017-07-16 (×5): 81 mg via ORAL
  Filled 2017-07-12 (×5): qty 1

## 2017-07-12 MED ORDER — ACETAMINOPHEN 650 MG RE SUPP: 650.0000 mg | Freq: Four times a day (QID) | RECTAL | Status: DC | PRN

## 2017-07-12 MED ORDER — WHITE PETROLATUM EX OINT
TOPICAL_OINTMENT | CUTANEOUS | Status: DC | PRN
  Administered 2017-07-12: 22:00:00 via TOPICAL
  Filled 2017-07-12: qty 28.35

## 2017-07-12 MED ORDER — ONDANSETRON HCL 4 MG/2ML IJ SOLN: 4.0000 mg | Freq: Four times a day (QID) | INTRAMUSCULAR | Status: DC | PRN

## 2017-07-12 MED ORDER — ACETAMINOPHEN 325 MG PO TABS
650.0000 mg | ORAL_TABLET | Freq: Four times a day (QID) | ORAL | Status: DC | PRN
  Administered 2017-07-13 – 2017-07-14 (×4): 650 mg via ORAL
  Filled 2017-07-12 (×4): qty 2

## 2017-07-12 MED ORDER — ADULT MULTIVITAMIN W/MINERALS CH
1.0000 | ORAL_TABLET | Freq: Every day | ORAL | Status: DC
  Administered 2017-07-12 – 2017-07-16 (×5): 1 via ORAL
  Filled 2017-07-12 (×5): qty 1

## 2017-07-12 MED ORDER — INSULIN ASPART 100 UNIT/ML ~~LOC~~ SOLN
0.0000 [IU] | Freq: Three times a day (TID) | SUBCUTANEOUS | Status: DC
  Administered 2017-07-12: 2 [IU] via SUBCUTANEOUS
  Administered 2017-07-12 (×2): 1 [IU] via SUBCUTANEOUS
  Administered 2017-07-13: 3 [IU] via SUBCUTANEOUS
  Administered 2017-07-13 – 2017-07-14 (×3): 1 [IU] via SUBCUTANEOUS
  Administered 2017-07-14: 3 [IU] via SUBCUTANEOUS
  Administered 2017-07-14: 1 [IU] via SUBCUTANEOUS
  Administered 2017-07-15 (×2): 2 [IU] via SUBCUTANEOUS
  Administered 2017-07-15: 3 [IU] via SUBCUTANEOUS
  Administered 2017-07-16: 2 [IU] via SUBCUTANEOUS
  Filled 2017-07-12: qty 1

## 2017-07-12 MED ORDER — SODIUM CHLORIDE 0.9 % IV SOLN
100.0000 mg | Freq: Two times a day (BID) | INTRAVENOUS | Status: DC
  Administered 2017-07-12 (×3): 100 mg via INTRAVENOUS
  Filled 2017-07-12 (×5): qty 100

## 2017-07-12 MED ORDER — ONDANSETRON HCL 4 MG PO TABS: 4.0000 mg | ORAL_TABLET | Freq: Four times a day (QID) | ORAL | Status: DC | PRN

## 2017-07-12 MED ORDER — LISINOPRIL 20 MG PO TABS
20.0000 mg | ORAL_TABLET | Freq: Every day | ORAL | Status: DC
  Administered 2017-07-12 – 2017-07-16 (×5): 20 mg via ORAL
  Filled 2017-07-12 (×5): qty 1

## 2017-07-12 NOTE — H&P (Signed)
History and Physical    GRACELYN COVENTRY NTI:[id number] DOB: April 07, 1961 DOA: 07/11/2017  PCP: [place], Betty G, MD  Patient coming from: Home.  Chief Complaint: Shortness of breath.  HPI: Robin Oliver is a 57 y.o. female with history of diastolic CHF last EF measured in January 2019 was 60-65% with grade 2 diastolic dysfunction, sleep apnea, diabetes mellitus type 2, hypertension presents to the ER because of worsening shortness of breath.  Patient states she has been having exertional symptoms for last few weeks but last evening after church she became acutely short of breath and was driving on the way back home and she decided to come to the ER.  Denies any chest pain.  Has been having some cough.  Patient states he recently changed her house and shortness of breath has become more worse.  ED Course: In the ER patient was acutely short of breath and became bradycardic and was placed on BiPAP.  Patient was given Lasix 80 mg IV and the nitroglycerin started.  Following which patient symptoms dramatically improved.  Patient at this time is able to talk sentences and is planned to be weaned off the BiPAP.  Chest x-ray shows congestion versus pneumonia.  Since patient also has cough along with Lasix doxycycline started.  Pro calcitonin ordered.  Patient admitted for further management of acute respiratory failure.  Review of Systems: As per HPI, rest all negative.   Past Medical History:  Diagnosis Date  . Allergy   . CHF (congestive heart failure) ([place])   . CHF (congestive heart failure) (HCC)    heart arrhythmia  . Hypertension   . OSA (obstructive sleep apnea)     History reviewed. No pertinent surgical history.   reports that she has never smoked. She has never used smokeless tobacco. She reports that she does not drink alcohol or use drugs.  Allergies  Allergen Reactions  . Peanut-Containing Drug Products Anaphylaxis    Pt states her allergy is to [facility] nuts only-  She does eat  other nuts Peanuts are legumes; states she CAN eat peanuts (??)  . Pine Shortness Of Breath    Family History  Problem Relation Age of Onset  . Congestive Heart Failure Mother   . Heart failure Mother   . Congestive Heart Failure Sister   . Congestive Heart Failure Brother     Prior to Admission medications   Medication Sig Start Date End Date Taking? Authorizing Provider  aspirin EC 81 MG tablet Take 81 mg by mouth daily.    Yes [provider]  carvedilol (COREG) 12.5 MG tablet Take 1 tablet (12.5 mg total) by mouth 2 (two) times daily with a meal. 05/16/17  Yes [place], Betty G, MD  furosemide (LASIX) 40 MG tablet Take 1 tablet (40 mg total) by mouth daily. 05/16/17  Yes [place], Betty G, MD  lisinopril (PRINIVIL,ZESTRIL) 20 MG tablet Take 1 tablet (20 mg total) by mouth daily. 05/16/17  Yes [place], Betty G, MD  Multiple Vitamin (MULTIVITAMIN WITH MINERALS) TABS tablet Take 1 tablet by mouth daily.   Yes [provider]  blood glucose meter kit and supplies KIT Dispense based on patient and insurance preference. Use up to four times daily as directed. (FOR ICD-9 250.00, 250.01). 05/08/17   Geradine Girt, DO    Physical Exam: Vitals:   07/11/17 2126 07/11/17 2130 07/11/17 2200 07/11/17 2230  BP: (!) 132/112 (!) 168/110 129/75 131/84  Pulse: (!) 121 (!) 113 95 95  Resp: Marland Kitchen)  36 (!) 50 (!) 40 (!) 30  Temp: 98 F (36.7 C)     TempSrc: Axillary     SpO2: 90% 91% 91% 94%  Weight:      Height:          Constitutional: Moderately built and nourished. Vitals:   07/11/17 2126 07/11/17 2130 07/11/17 2200 07/11/17 2230  BP: (!) 132/112 (!) 168/110 129/75 131/84  Pulse: (!) 121 (!) 113 95 95  Resp: (!) 36 (!) 50 (!) 40 (!) 30  Temp: 98 F (36.7 C)     TempSrc: Axillary     SpO2: 90% 91% 91% 94%  Weight:      Height:       Eyes: Anicteric no pallor. ENMT: No discharge from the ears eyes nose or mouth. Neck: JVD elevated no mass felt. Respiratory: No rhonchi no  crepitations. Cardiovascular: S1-S2 heard no murmurs appreciated.  Abdomen: Soft nontender bowel sounds present. Musculoskeletal: No edema.  No joint effusion. Skin: No rash.  Skin appears warm. Neurologic: Alert awake oriented to time place and person.  Moves all extremities. Psychiatric: Appears normal.  Normal affect.   Labs on Admission: I have personally reviewed following labs and imaging studies  CBC: Recent Labs  Lab 07/11/17 2129  WBC 18.6*  NEUTROABS 9.8*  HGB 17.6*  HCT 54.3*  MCV 83.3  PLT 546   Basic Metabolic Panel: Recent Labs  Lab 07/11/17 2129  NA 137  K 4.4  CL 102  CO2 18*  GLUCOSE 206*  BUN 17  CREATININE 1.32*  CALCIUM 9.6   GFR: Estimated Creatinine Clearance: 65.9 mL/min (A) (by C-G formula based on SCr of 1.32 mg/dL (H)). Liver Function Tests: No results for input(s): AST, ALT, ALKPHOS, BILITOT, PROT, ALBUMIN in the last 168 hours. No results for input(s): LIPASE, AMYLASE in the last 168 hours. No results for input(s): AMMONIA in the last 168 hours. Coagulation Profile: No results for input(s): INR, PROTIME in the last 168 hours. Cardiac Enzymes: No results for input(s): CKTOTAL, CKMB, CKMBINDEX, TROPONINI in the last 168 hours. BNP (last 3 results) Recent Labs    05/16/17 0955  PROBNP 83.0   HbA1C: No results for input(s): HGBA1C in the last 72 hours. CBG: No results for input(s): GLUCAP in the last 168 hours. Lipid Profile: No results for input(s): CHOL, HDL, LDLCALC, TRIG, CHOLHDL, LDLDIRECT in the last 72 hours. Thyroid Function Tests: No results for input(s): TSH, T4TOTAL, FREET4, T3FREE, THYROIDAB in the last 72 hours. Anemia Panel: No results for input(s): VITAMINB12, FOLATE, FERRITIN, TIBC, IRON, RETICCTPCT in the last 72 hours. Urine analysis:    Component Value Date/Time   COLORURINE YELLOW 05/06/2017 [place] 05/06/2017 1815   LABSPEC 1.013 05/06/2017 1815   PHURINE 5.0 05/06/2017 1815   GLUCOSEU  NEGATIVE 05/06/2017 1815   HGBUR LARGE (A) 05/06/2017 1815   BILIRUBINUR NEGATIVE 05/06/2017 1815   KETONESUR NEGATIVE 05/06/2017 1815   PROTEINUR NEGATIVE 05/06/2017 1815   NITRITE NEGATIVE 05/06/2017 1815   LEUKOCYTESUR NEGATIVE 05/06/2017 1815   Sepsis Labs: @LABRCNTIP (procalcitonin:4,lacticidven:4) )No results found for this or any previous visit (from the past 240 hour(s)).   Radiological Exams on Admission: Dg Chest Port 1 View  Result Date: 07/11/2017 CLINICAL DATA:  Respiratory distress today. EXAM: PORTABLE CHEST 1 VIEW COMPARISON:  Chest radiograph May 06, 2017 FINDINGS: Similar cardiomegaly. Diffuse interstitial and alveolar airspace opacities new from prior examination. Small probable pleural effusions. No pneumothorax. Soft tissue planes and included osseous structures are unchanged.  IMPRESSION: New interstitial alveolar airspace opacities seen with CHF and/or pneumonia. Small probable pleural effusions. Stable cardiomegaly. Electronically Signed   By: Elon Alas M.D.   On: 07/11/2017 21:51    EKG: Independently reviewed.  Sinus tachycardia.  Assessment/Plan Principal Problem:   Acute respiratory failure with hypoxia (HCC) Active Problems:   Acute on chronic diastolic CHF (congestive heart failure) (HCC)   Diabetes mellitus type 2 in obese (HCC)   OSA (obstructive sleep apnea)   Essential hypertension    1. Acute respiratory failure with hypoxia most likely from acute decompensation of diastolic CHF last EF measured in January 2019 3 months ago was 60-65% with grade 2 diastolic dysfunction.  Patient was given Lasix 80 mg IV in the ER and nitroglycerin infusion started.  I have placed patient on Lasix 60 mg IV every 12.  Closely monitor intake output and metabolic panel and daily weights.  Will wean off nitroglycerin infusion.  Patient will be BiPAP which will be slowly weaned off and patient does need to be on CPAP at bedtime for sleep apnea.  In addition patient  is on lisinopril and Coreg.  Since patient is having recurrent cough and has leukocytosis I have placed patient on doxycycline.  Will check procalcitonin levels and if negative discontinue antibiotics. 2. Hypertension uncontrolled on lisinopril and Coreg.  Slowly wean off nitroglycerin infusion. 3. Diabetes mellitus type 2 with hyperglycemia.  Patient states she was recently started on oral hypoglycemics and has not taken.  Last hemoglobin A1c during last admission 3 months ago was around 6.4.  Patient placed on sliding scale coverage.  Closely follow CBGs. 4. Polycythemia -appears to be new.  Follow CBC. 5. Sleep apnea not sure if patient is compliant with CPAP.   DVT prophylaxis: Lovenox. Code Status: Full code. Family Communication: Discussed with patient. Disposition Plan: Home. Consults called: None. Admission status: Inpatient.   Rise Patience MD [facility] Pager [phone].  If 7PM-7AM, please contact night-coverage [email or web address] Password [email or web address]  07/12/2017, 12:22 AM

## 2017-07-12 NOTE — ED Notes (Signed)
Heart Healthy/ Carb Modified Diet was ordered for Lunch. 

## 2017-07-12 NOTE — Progress Notes (Signed)
Inpatient Diabetes Program Recommendations  AACE/ADA: New Consensus Statement on Inpatient Glycemic Control (2015)  Target Ranges:  Prepandial:   less than 140 mg/dL      Peak postprandial:   less than 180 mg/dL (1-2 hours)      Critically ill patients:  140 - 180 mg/dL  Results for Robin Oliver, Robin Oliver (MRN [id number]) as of 07/12/2017 13:17  Ref. Range 07/12/2017 08:41 07/12/2017 10:17 07/12/2017 11:49  Glucose-Capillary Latest Ref Range: 65 - 99 mg/dL 213262 (H) 086162 (H) 578125 (H)   Results for Robin Oliver, Robin Oliver (MRN [id number]) as of 07/12/2017 13:17  Ref. Range 05/08/2017 05:41  Hemoglobin A1C Latest Ref Range: 4.8 - 5.6 % 8.6 (H)   Review of Glycemic Control  Diabetes history: DM2 Outpatient Diabetes medications: none Current orders for Inpatient glycemic control: Novolog 0-9 units TID with meals  Inpatient Diabetes Program Recommendations:  HgbA1C: A1C 8.6% on 05/08/17 during last hospital admission. Patient was seen by a Diabetes Coordinator on 05/08/17 and did not want to start on oral DM medication until she followed up with PCP. Noted patient seen PCP on 05/16/17 and patient did not want to take any medicaitons for DM. Please consider ordering a new A1C to determine if it has improved or worsened.   Thanks, Orlando PennerMarie Shellene Sweigert, RN, MSN, CDE Diabetes Coordinator Inpatient Diabetes Program [phone] (Team Pager from 8am to 5pm)

## 2017-07-12 NOTE — Progress Notes (Signed)
Patient seen and examined this morning, admitted overnight by Dr. Gilford Silviuskakrakandi  57 year old female with history of diastolic CHF, sleep apnea, type 2 diabetes mellitus, hypertension who was brought to the ED due to shortness of breath over the last few weeks, however acutely worsened in the last 24 hours.  In the ED she required BiPAP as well as nitroglycerin infusion, received Lasix and improved.  On my evaluation she is on nasal cannula and already breathing better   Acute hypoxic respiratory failure -Due to acute decompensation of diastolic CHF -Continue IV Lasix twice daily, she is off nitroglycerin infusion, discontinue  Hypertension -Continue Coreg, lisinopril  Type 2 diabetes mellitus -Continue sliding scale  Polycythemia -Follow CBC  Active sleep apnea -Does not have a CPAP but has a positive study a few years ago, will consult care management  Genavive Kubicki M. Elvera LennoxGherghe, MD [facility] [phone]  If 7PM-7AM, please contact night-coverage [email or web address] Password [email or web address]

## 2017-07-12 NOTE — ED Notes (Signed)
Patient states did have breakfast today and then CBG was taken. No insulin given at 0800.

## 2017-07-12 NOTE — ED Notes (Signed)
Pt weaned off the iv nitro

## 2017-07-12 NOTE — ED Notes (Signed)
The pt is asking for her bi-pap to be removed resp therapy aware. They will see as soon as they are available

## 2017-07-12 NOTE — ED Notes (Signed)
Call to the doctor  Taking the nitro drip down to . min

## 2017-07-12 NOTE — ED Notes (Signed)
Admitting at the bedside.  

## 2017-07-12 NOTE — ED Notes (Signed)
Pt c/o being cold   Temp increased in the room

## 2017-07-12 NOTE — ED Notes (Signed)
Admitting at bedside 

## 2017-07-12 NOTE — ED Notes (Signed)
The pt is still off the bi-pap her sats are staying  The high 90s on high flow 02 12 liters.  Off the iv nitro .  Lasix given output good

## 2017-07-12 NOTE — ED Notes (Signed)
Pt to be weaned off the iv nitro before giving the iv lasix  Breakfast requested

## 2017-07-12 NOTE — ED Notes (Signed)
Nitro drip decreased

## 2017-07-12 NOTE — ED Notes (Signed)
Pt sleeping. 

## 2017-07-12 NOTE — ED Notes (Signed)
Report accepted by  Sabino Dickeaelda, RN

## 2017-07-13 DIAGNOSIS — I509 Heart failure, unspecified: Secondary | ICD-10-CM

## 2017-07-13 LAB — GLUCOSE, CAPILLARY
GLUCOSE-CAPILLARY: 244 mg/dL — AB (ref 65–99)
GLUCOSE-CAPILLARY: 129 mg/dL — AB (ref 65–99)
Glucose-Capillary: 171 mg/dL — ABNORMAL HIGH (ref 65–99)
Glucose-Capillary: 137 mg/dL — ABNORMAL HIGH (ref 65–99)
Glucose-Capillary: 173 mg/dL — ABNORMAL HIGH (ref 65–99)

## 2017-07-13 LAB — BASIC METABOLIC PANEL
ANION GAP: 10 (ref 5–15)
BUN: 22 mg/dL — AB (ref 6–20)
CO2: 29 mmol/L (ref 22–32)
Calcium: 9.4 mg/dL (ref 8.9–10.3)
Chloride: 101 mmol/L (ref 101–111)
Creatinine, Ser: 1.15 mg/dL — ABNORMAL HIGH (ref 0.44–1.00)
GFR, EST NON AFRICAN AMERICAN: 52 mL/min — AB (ref >60)
Glucose, Bld: 124 mg/dL — ABNORMAL HIGH (ref 65–99)
POTASSIUM: 4.1 mmol/L (ref 3.5–5.1)
SODIUM: 140 mmol/L (ref 135–145)

## 2017-07-13 MED ORDER — LIVING BETTER WITH HEART FAILURE BOOK
Freq: Once | Status: AC
  Administered 2017-07-13: 13:00:00
  Filled 2017-07-13: qty 1

## 2017-07-13 MED ORDER — DOXYCYCLINE HYCLATE 100 MG PO TABS
100.0000 mg | ORAL_TABLET | Freq: Two times a day (BID) | ORAL | Status: DC
  Administered 2017-07-13 – 2017-07-16 (×7): 100 mg via ORAL
  Filled 2017-07-13 (×7): qty 1

## 2017-07-13 NOTE — Progress Notes (Signed)
Heart failure packet given to patient and book ordered from pharmacy.Will report to next shift to give book when available.

## 2017-07-13 NOTE — Progress Notes (Signed)
CPAP is not available at this time when one becomes available PT will be placed on CPAP.

## 2017-07-13 NOTE — Progress Notes (Signed)
CPAP machine not available at this time.  PT will be placed on CPAP when one becomes available.   

## 2017-07-13 NOTE — Progress Notes (Signed)
IV Nurse went to assess patient and to attempt Peripheral IV, Patient was currently being stuck by lab. Lab professional was unsuccessful and the patient begin to refuse IV services and Lab services. Patient stated she has been stuck several times and staff was unsuccessful. IV nurse asked MD Gherghe if patient was appropriate for Midline or possible picc line. MD stated patient would only be here for 2-3 days, 3 days at the max and stated a midline would be best. Order was changed by Nurse on the floor from peripheral IV to Midline. Patient requested that midline be placed around 4 pm to give her arms and hands time to rest. MD Elvera LennoxGherghe was made aware via verbal conversation. RN on the floor made aware as well by verbal conversation.

## 2017-07-13 NOTE — Clinical Social Work Note (Signed)
Pt requested to speak with CSW. Pt currently lives in apartment complex and is surrounded by individuals who smoke and  states ever since she has been living there her health has went downhill. Pt requesting a letter stating she was hospitalized due to her CHF and how it would benefit her to live in a environment that does not allow smoking--CSW provided pt with the letter. Clinical Social Worker will sign off for now as social work intervention is no longer needed. Please consult us again if new need arises.   Clarisse GougeBridget A Serge Main 07/13/2017

## 2017-07-13 NOTE — Progress Notes (Signed)
PROGRESS NOTE    NIKOLETTE REINDL  ZOX:[id number] DOB: 10-01-1960 DOA: 07/11/2017 PCP: [place], Betty G, MD   Brief Narrative:  Birdie Frieson is a 57 y.o. Female who presented to the ER with progessively worsening shortness of breath for the past few weeks. She has a past medical history significant for diastolic CHF with grade 2 diastolic dysfunction, sleep apnea, diabetes mellitus type 2 and hypertension. Last EF from January 2019 was 60-65%. She states she recently switched insurances and is now receiving her Lasix from [facility] mail away pharmacy, which she believes is a contributing factor to her SOB, since then she has been in the hospital twice for similar symptoms. In addition to this she recently moved into a new apartment that seems to have mold and many people smoke, also thinks this could be a contributing factor. She was started on Lasix 80 mg IV, Nitroglycerin and BiPAP in the ER and her symptoms drastically improved.   Assessment & Plan:   Principal Problem:   Acute respiratory failure with hypoxia (HCC) Active Problems:   Acute on chronic diastolic CHF (congestive heart failure) (HCC)   Diabetes mellitus type 2 in obese (HCC)   OSA (obstructive sleep apnea)   Essential hypertension  Acute respiratory failure with hypoxia -Due to acute decompensation of diastolic CHF. -On exam she is on nasal cannula 4L with O2 sat 100% and breathing better.  -CXR shows new interstitial alveolar airspace opcaities seen with CHF and/or pneumonia. Stable cardiomegaly. -Started doxycycline to cover for pneumonia.  Hypertension -Continue Coreg, lisinopril.  DMII -Continue SSI.  Polycythemia  Active sleep apnea  -Plan to start CPAP upon discharge.  DVT prophylaxis: Lovenox. Code Status: Full Family Communication: None, discussed with patient. Disposition Plan: Inpatient, pending clinical improvement.   Antimicrobials:   Doxycycline 100 mg Q12H   Subjective: Patient is awake,  alert and oriented x 3. Sitting in chair comfortably with legs elevated, using nasal cannula 4L. Reports having "tightness" in her abdomen although she denies any nausea or vomiting. Also states feeling slightly febrile although her last temperature reading at 1204 was 98 degrees F. Positive for headache, dyspnea with exertion, cough, wheezing and yellow sputum production. Denies chest pain.  Objective: Vitals:   07/13/17 0447 07/13/17 0754 07/13/17 1145 07/13/17 1204  BP: 135/85 (!) 115/50  109/64  Pulse: 77 78  70  Resp: (!) 21 17  (!) 23  Temp: 98.3 F (36.8 C) 97.8 F (36.6 C)  98 F (36.7 C)  TempSrc: Oral Oral  Oral  SpO2: 93% 95%  96%  Weight: 134.5 kg (296 lb 8.3 oz)  131.9 kg (290 lb 12.6 oz)   Height:        Intake/Output Summary (Last 24 hours) at 07/13/2017 1244 Last data filed at 07/13/2017 0600 Gross per 24 hour  Intake 620 ml  Output 2700 ml  Net -2080 ml   Filed Weights   07/12/17 1254 07/13/17 0447 07/13/17 1145  Weight: 133.8 kg (294 lb 15.6 oz) 134.5 kg (296 lb 8.3 oz) 131.9 kg (290 lb 12.6 oz)    Examination:  General exam: Appears calm and comfortable  Respiratory system: Lung sounds distant . Clear to auscultation. Respiratory effort normal. Cardiovascular system: S1 & S2 heard, RRR. No murmurs, rubs, gallops or clicks. Positive for non-pitting edema. Gastrointestinal system: Abdomen is distended, soft with diffuse mild tenderness. Normal bowel sounds heard. Central nervous system: Alert and oriented. No focal neurological deficits. Extremities: Able to move all 4 extremities, ambulates by  her self. Skin: No rashes, lesions or ulcers Psychiatry: Judgement and insight appear normal. Mood & affect appropriate.    Data Reviewed: I have personally reviewed following labs and imaging studies  CBC: Recent Labs  Lab 07/11/17 2129 07/12/17 0416  WBC 18.6* 17.6*  NEUTROABS 9.8*  --   HGB 17.6* 15.8*  HCT 54.3* 48.7*  MCV 83.3 83.8  PLT 256 225   Basic  Metabolic Panel: Recent Labs  Lab 07/11/17 2129 07/12/17 1228  NA 137 140  K 4.4 3.8  CL 102 101  CO2 18* 25  GLUCOSE 206* 154*  BUN 17 17  CREATININE 1.32* 1.22*  CALCIUM 9.6 9.2  MG  --  2.0   GFR: Estimated Creatinine Clearance: 69.6 mL/min (A) (by C-G formula based on SCr of 1.22 mg/dL (H)). Liver Function Tests: No results for input(s): AST, ALT, ALKPHOS, BILITOT, PROT, ALBUMIN in the last 168 hours. No results for input(s): LIPASE, AMYLASE in the last 168 hours. No results for input(s): AMMONIA in the last 168 hours. Coagulation Profile: No results for input(s): INR, PROTIME in the last 168 hours. Cardiac Enzymes: Recent Labs  Lab 07/12/17 0416 07/12/17 1228  TROPONINI 0.03* <0.03   BNP (last 3 results) Recent Labs    05/16/17 0955  PROBNP 83.0   HbA1C: No results for input(s): HGBA1C in the last 72 hours. CBG: Recent Labs  Lab 07/12/17 1647 07/12/17 2117 07/13/17 0750 07/13/17 0930 07/13/17 1158  GLUCAP 140* 176* 171* 244* 137*   Lipid Profile: No results for input(s): CHOL, HDL, LDLCALC, TRIG, CHOLHDL, LDLDIRECT in the last 72 hours. Thyroid Function Tests: Recent Labs    07/12/17 0416  TSH 0.848   Anemia Panel: No results for input(s): VITAMINB12, FOLATE, FERRITIN, TIBC, IRON, RETICCTPCT in the last 72 hours. Sepsis Labs: Recent Labs  Lab 07/12/17 1228  PROCALCITON 0.24    Recent Results (from the past 240 hour(s))  MRSA PCR Screening     Status: None   Collection Time: 07/12/17  4:23 PM  Result Value Ref Range Status   MRSA by PCR NEGATIVE NEGATIVE Final    Comment:        The GeneXpert MRSA Assay (FDA approved for NASAL specimens only), is one component of a comprehensive MRSA colonization surveillance program. It is not intended to diagnose MRSA infection nor to guide or monitor treatment for MRSA infections. Performed at [facility], [address], [place], [place] [address]          Radiology Studies: Dg  Chest Port 1 View  Result Date: 07/11/2017 CLINICAL DATA:  Respiratory distress today. EXAM: PORTABLE CHEST 1 VIEW COMPARISON:  Chest radiograph May 06, 2017 FINDINGS: Similar cardiomegaly. Diffuse interstitial and alveolar airspace opacities new from prior examination. Small probable pleural effusions. No pneumothorax. Soft tissue planes and included osseous structures are unchanged. IMPRESSION: New interstitial alveolar airspace opacities seen with CHF and/or pneumonia. Small probable pleural effusions. Stable cardiomegaly. Electronically Signed   By: Awilda Metro M.D.   On: 07/11/2017 21:51        Scheduled Meds: . aspirin EC  81 mg Oral Daily  . carvedilol  12.5 mg Oral BID WC  . doxycycline  100 mg Oral Q12H  . enoxaparin (LOVENOX) injection  40 mg Subcutaneous Daily  . furosemide  60 mg Intravenous Q12H  . insulin aspart  0-9 Units Subcutaneous TID WC  . lisinopril  20 mg Oral Daily  . Living Better with Heart Failure Book   Does  not apply Once  . multivitamin with minerals  1 tablet Oral Daily   Continuous Infusions: . nitroGLYCERIN Stopped (07/12/17 0614)     LOS: 1 day    Time spent: 25 minutes.    Loney LohBeatriz Kacy Conely, PA-S [facility] Pager [phone]  If 7PM-7AM, please contact night-coverage [email or web address] Password [email or web address] 07/13/2017, 12:44 PM

## 2017-07-14 DIAGNOSIS — J810 Acute pulmonary edema: Secondary | ICD-10-CM

## 2017-07-14 LAB — GLUCOSE, CAPILLARY
GLUCOSE-CAPILLARY: 141 mg/dL — AB (ref 65–99)
Glucose-Capillary: 237 mg/dL — ABNORMAL HIGH (ref 65–99)
Glucose-Capillary: 126 mg/dL — ABNORMAL HIGH (ref 65–99)
Glucose-Capillary: 225 mg/dL — ABNORMAL HIGH (ref 65–99)

## 2017-07-14 NOTE — Progress Notes (Signed)
Patient rested comfortably this shift.  No complaints of shortness of breath or pain. Safety and comfort measures maintained.  Call bell within reach.

## 2017-07-14 NOTE — Progress Notes (Signed)
PROGRESS NOTE  WALAA CAREL WUJ:[id number] DOB: [birth year] DOA: 07/11/2017 PCP: [place], Betty G, MD   LOS: 2 days   Brief Narrative / Interim history: 57 year old female with history of diastolic CHF, OSA, diabetes mellitus, hypertension who was brought to the ED due to shortness of breath and progressive fluid overload, requiring BiPAP in the ED for hypoxic respiratory failure.  She was started on IV diuresis.  Assessment & Plan: Principal Problem:   Acute respiratory failure with hypoxia (HCC) Active Problems:   Acute on chronic diastolic CHF (congestive heart failure) (HCC)   Diabetes mellitus type 2 in obese (HCC)   OSA (obstructive sleep apnea)   Essential hypertension   Acute hypoxic respiratory failure due to pulmonary edema due to acute on chronic diastolic CHF -Continue IV Lasix twice daily, she is off nitroglycerin infusion -Weight improved from 295 on admission to 289 this morning, she is net -4.9 L.  She still has pitting edema, continue IV Lasix, renal function stable, potassium is stable.  Add compression stockings  Hypertension -Continue Coreg, lisinopril -Blood pressure 112/65, stable  Type 2 diabetes mellitus -Continue sliding scale, CBG is within acceptable range 120s-240s  Polycythemia -Follow CBC, recheck tomorrow  Active sleep apnea -Does not have a CPAP but has a positive study a few years ago, will consult care management -Consulted care management  Morbid obesity -Discussed with patient today at bedside regarding dietary changes, weight loss goal and exercise    DVT prophylaxis: Lovenox Code Status: Full code Family Communication: no family at bedside Disposition Plan: home likely Monday   Consultants:   None   Procedures:   None   Antimicrobials:  Doxycycline 4/4 >>    Subjective: -Feeling a little better, breathing is better however still gets dyspneic with ambulation, no chest pain, no palpitations  Objective: Vitals:   07/14/17 0318 07/14/17 0728 07/14/17 0800 07/14/17 0918  BP: (!) 141/89  112/65 112/65  Pulse: 74  78 83  Resp: (!) 21  18   Temp: 98.4 F (36.9 C)  98 F (36.7 C)   TempSrc: Oral  Oral   SpO2: 99%  100%   Weight:  131.4 kg (289 lb 11.2 oz)    Height:        Intake/Output Summary (Last 24 hours) at 07/14/2017 1116 Last data filed at 07/14/2017 0928 Gross per 24 hour  Intake 532 ml  Output 1850 ml  Net -1318 ml   Filed Weights   07/13/17 1145 07/14/17 0200 07/14/17 0728  Weight: 131.9 kg (290 lb 12.6 oz) 134.1 kg (295 lb 10.2 oz) 131.4 kg (289 lb 11.2 oz)    Examination:  Constitutional: NAD Eyes:  lids and conjunctivae normal ENMT: Mucous membranes are moist. Respiratory: clear to auscultation bilaterally, no wheezing, no crackles. Normal respiratory effort. No accessory muscle use.  Cardiovascular: Regular rate and rhythm, no murmurs / rubs / gallops.  1-2+ pitting LE edema. 2+ pedal pulses. No carotid bruits.  Abdomen: no tenderness. Bowel sounds positive.  Mildly distended Musculoskeletal: no clubbing / cyanosis. Skin: no rashes Neurologic: Nonfocal, ambulatory  Data Reviewed: I have independently reviewed following labs and imaging studies   CBC: Recent Labs  Lab 07/11/17 2129 07/12/17 0416  WBC 18.6* 17.6*  NEUTROABS 9.8*  --   HGB 17.6* 15.8*  HCT 54.3* 48.7*  MCV 83.3 83.8  PLT 256 225   Basic Metabolic Panel: Recent Labs  Lab 07/11/17 2129 07/12/17 1228 07/13/17 1623  NA 137 140 140  K 4.4 3.8  4.1  CL 102 101 101  CO2 18* 25 29  GLUCOSE 206* 154* 124*  BUN 17 17 22*  CREATININE 1.32* 1.22* 1.15*  CALCIUM 9.6 9.2 9.4  MG  --  2.0  --    GFR: Estimated Creatinine Clearance: 73.6 mL/min (A) (by C-G formula based on SCr of 1.15 mg/dL (H)). Liver Function Tests: No results for input(s): AST, ALT, ALKPHOS, BILITOT, PROT, ALBUMIN in the last 168 hours. No results for input(s): LIPASE, AMYLASE in the last 168 hours. No results for input(s): AMMONIA  in the last 168 hours. Coagulation Profile: No results for input(s): INR, PROTIME in the last 168 hours. Cardiac Enzymes: Recent Labs  Lab 07/12/17 0416 07/12/17 1228  TROPONINI 0.03* <0.03   BNP (last 3 results) Recent Labs    05/16/17 0955  PROBNP 83.0   HbA1C: No results for input(s): HGBA1C in the last 72 hours. CBG: Recent Labs  Lab 07/13/17 0930 07/13/17 1158 07/13/17 1623 07/13/17 2128 07/14/17 0828  GLUCAP 244* 137* 129* 173* 237*   Lipid Profile: No results for input(s): CHOL, HDL, LDLCALC, TRIG, CHOLHDL, LDLDIRECT in the last 72 hours. Thyroid Function Tests: Recent Labs    07/12/17 0416  TSH 0.848   Anemia Panel: No results for input(s): VITAMINB12, FOLATE, FERRITIN, TIBC, IRON, RETICCTPCT in the last 72 hours. Urine analysis:    Component Value Date/Time   COLORURINE YELLOW 05/06/2017 1815   APPEARANCEUR CLEAR 05/06/2017 1815   LABSPEC 1.013 05/06/2017 1815   PHURINE 5.0 05/06/2017 1815   GLUCOSEU NEGATIVE 05/06/2017 1815   HGBUR LARGE (A) 05/06/2017 1815   BILIRUBINUR NEGATIVE 05/06/2017 1815   KETONESUR NEGATIVE 05/06/2017 1815   PROTEINUR NEGATIVE 05/06/2017 1815   NITRITE NEGATIVE 05/06/2017 1815   LEUKOCYTESUR NEGATIVE 05/06/2017 1815   Sepsis Labs: Invalid input(s): PROCALCITONIN, LACTICIDVEN  Recent Results (from the past 240 hour(s))  MRSA PCR Screening     Status: None   Collection Time: 07/12/17  4:23 PM  Result Value Ref Range Status   MRSA by PCR NEGATIVE NEGATIVE Final    Comment:        The GeneXpert MRSA Assay (FDA approved for NASAL specimens only), is one component of a comprehensive MRSA colonization surveillance program. It is not intended to diagnose MRSA infection nor to guide or monitor treatment for MRSA infections. Performed at [facility], [address] [place], [place] [address]       Radiology Studies: No results found.   Scheduled Meds: . aspirin EC  81 mg Oral Daily  . carvedilol  12.5  mg Oral BID WC  . doxycycline  100 mg Oral Q12H  . enoxaparin (LOVENOX) injection  40 mg Subcutaneous Daily  . furosemide  60 mg Intravenous Q12H  . insulin aspart  0-9 Units Subcutaneous TID WC  . lisinopril  20 mg Oral Daily  . multivitamin with minerals  1 tablet Oral Daily   Continuous Infusions: . nitroGLYCERIN Stopped (07/12/17 [id number])    Pamella Pertostin Gherghe, MD, PhD [facility] Pager [phone]  If 7PM-7AM, please contact night-coverage [email or web address] Password [email or web address] 07/14/2017, 11:16 AM

## 2017-07-14 NOTE — Progress Notes (Signed)
All CPAPs are in use at this time. Will supply for patient if one is found.  

## 2017-07-14 NOTE — Care Management Note (Signed)
Case Management Note  Patient Details  Name: Robin Oliver MRN: [id number] Date of Birth: Aug 19, 1960  Subjective/Objective:                 Spoke to patient at the bedside she states she last has a sleep study 2 years ago. The barrier in the past two years been her ability to pay for it. Unfortunately her sleep study is now expired ans she will need another one to qualify. This will need to be set up through her PCP.  Discussed importance of daily weights with patient.   PCP Betty [place] MD [facility]   Action/Plan:   Expected Discharge Date:                  Expected Discharge Plan:     In-House Referral:     Discharge planning Services     Post Acute Care Choice:    Choice offered to:     DME Arranged:    DME Agency:     HH Arranged:    HH Agency:     Status of Service:     If discussed at [employer] Length of Stay Meetings, dates discussed:    Additional Comments:  Lawerance SabalDebbie Jonathyn Carothers, RN 07/14/2017, 2:27 PM

## 2017-07-14 NOTE — Plan of Care (Signed)
Patient and I discussed importance of daily weights and adhereing to the fluid restrictions. Patient voiced concerns with medication management via [employer] and their mail service. She [facility] so she could retrieve them from there. Pt verbalized understanding of taking medications and monitoring weight and overall feeling of comfort and work of breathing.

## 2017-07-14 NOTE — Plan of Care (Signed)
Patient progressing in care plan goals.   

## 2017-07-14 NOTE — Evaluation (Signed)
Physical Therapy Evaluation Patient Details Name: Robin Oliver MRN: [id number] DOB: 1960-09-25 Today's Date: 07/14/2017   History of Present Illness  57 y.o. female admitted for worsening SOB. Pt was found to have pulmonary edema. PMH includes CHF, HTN, OSA, DM type II.  Clinical Impression  Pt presents to physical therapy secondary to above. Pt demonstrated bed mobility, transfers, ambulation, and stair negotiation at mod I level for increased time. Mild DOE noted with stair negotiation, but able to safely tolerate ascending/descending flight of stairs x2 with standing rest breaks at top of steps. SpO2 remained >94% on room air throughout ambulation and dropped to 90% during stair negotiation that quickly recovered with standing rest break. No further PT services needed, will sign off at this time.     Follow Up Recommendations No PT follow up    Equipment Recommendations  None recommended by PT    Recommendations for Other Services       Precautions / Restrictions Restrictions Weight Bearing Restrictions: No      Mobility  Bed Mobility Overal bed mobility: Independent                Transfers Overall transfer level: Independent Equipment used: None                Ambulation/Gait Ambulation/Gait assistance: Modified independent (Device/Increase time) Ambulation Distance (Feet): 300 Feet Assistive device: None Gait Pattern/deviations: Step-through pattern Gait velocity: slightly decreased Gait velocity interpretation: Below normal speed for age/gender General Gait Details: mod I for increased time. no instability or LOB. able to hold conversation while walking  Stairs Stairs: Yes Stairs assistance: Modified independent (Device/Increase time) Stair Management: One rail Left;Alternating pattern;Step to pattern;Forwards Number of Stairs: 10(10x2) General stair comments: alternating pattern ascending, step to pattern descending. noted mild DOE with stair  negotiation and standing rest break at top of steps. O2 >90% throughout. no LOB  Wheelchair Mobility    Modified Rankin (Stroke Patients Only)       Balance Overall balance assessment: No apparent balance deficits (not formally assessed)                                           Pertinent Vitals/Pain Pain Assessment: No/denies pain    Home Living Family/patient expects to be discharged to:: Private residence Living Arrangements: Alone   Type of Home: Apartment Home Access: Stairs to enter Entrance Stairs-Rails: Right;Left;Can reach both Entrance Stairs-Number of Steps: 2 flights Home Layout: One level Home Equipment: None      Prior Function Level of Independence: Independent               Hand Dominance        Extremity/Trunk Assessment   Upper Extremity Assessment Upper Extremity Assessment: Overall WFL for tasks assessed    Lower Extremity Assessment Lower Extremity Assessment: Overall WFL for tasks assessed       Communication   Communication: No difficulties  Cognition Arousal/Alertness: Awake/alert Behavior During Therapy: WFL for tasks assessed/performed Overall Cognitive Status: Within Functional Limits for tasks assessed                                        General Comments General comments (skin integrity, edema, etc.): mild DOE with stair negotiation. O2 >90% on RA throughout session  Exercises     Assessment/Plan    PT Assessment Patent does not need any further PT services  PT Problem List         PT Treatment Interventions      PT Goals (Current goals can be found in the Care Plan section)  Acute Rehab PT Goals Patient Stated Goal: to get back to normal daily activities    Frequency     Barriers to discharge        Co-evaluation               AM-PAC PT "6 Clicks" Daily Activity  Outcome Measure Difficulty turning over in bed (including adjusting bedclothes, sheets and  blankets)?: None Difficulty moving from lying on back to sitting on the side of the bed? : None Difficulty sitting down on and standing up from a chair with arms (e.g., wheelchair, bedside commode, etc,.)?: None Help needed moving to and from a bed to chair (including a wheelchair)?: None Help needed walking in hospital room?: None Help needed climbing 3-5 steps with a railing? : None 6 Click Score: 24    End of Session Equipment Utilized During Treatment: Gait belt Activity Tolerance: Patient tolerated treatment well Patient left: in chair;with call bell/phone within reach Nurse Communication: Mobility status PT Visit Diagnosis: Difficulty in walking, not elsewhere classified (R26.2)    Time: 1152-1208 PT Time Calculation (min) (ACUTE ONLY): 16 min   Charges:   PT Evaluation $PT Eval Low Complexity: 1 Low     PT G Codes:        Barrie Dunker, SPT   Barrie Dunker 07/14/2017, 12:37 PM

## 2017-07-15 LAB — BASIC METABOLIC PANEL
Anion gap: 14 (ref 5–15)
BUN: 17 mg/dL (ref 6–20)
CALCIUM: 9.8 mg/dL (ref 8.9–10.3)
CO2: 27 mmol/L (ref 22–32)
CREATININE: 1.03 mg/dL — AB (ref 0.44–1.00)
Chloride: 99 mmol/L — ABNORMAL LOW (ref 101–111)
GFR calc non Af Amer: 60 mL/min — ABNORMAL LOW (ref >60)
Glucose, Bld: 172 mg/dL — ABNORMAL HIGH (ref 65–99)
Potassium: 3.4 mmol/L — ABNORMAL LOW (ref 3.5–5.1)
Sodium: 140 mmol/L (ref 135–145)

## 2017-07-15 LAB — GLUCOSE, CAPILLARY
Glucose-Capillary: 208 mg/dL — ABNORMAL HIGH (ref 65–99)
Glucose-Capillary: 151 mg/dL — ABNORMAL HIGH (ref 65–99)
Glucose-Capillary: 187 mg/dL — ABNORMAL HIGH (ref 65–99)
Glucose-Capillary: 175 mg/dL — ABNORMAL HIGH (ref 65–99)

## 2017-07-15 LAB — CBC
HCT: 46.3 % — ABNORMAL HIGH (ref 36.0–46.0)
Hemoglobin: 15 g/dL (ref 12.0–15.0)
MCH: 27 pg (ref 26.0–34.0)
MCHC: 32.4 g/dL (ref 30.0–36.0)
MCV: 83.3 fL (ref 78.0–100.0)
PLATELETS: 254 10*3/uL (ref 150–400)
RBC: 5.56 MIL/uL — ABNORMAL HIGH (ref 3.87–5.11)
RDW: 16.2 % — ABNORMAL HIGH (ref 11.5–15.5)
WBC: 10.3 10*3/uL (ref 4.0–10.5)

## 2017-07-15 MED ORDER — POTASSIUM CHLORIDE CRYS ER 20 MEQ PO TBCR
40.0000 meq | EXTENDED_RELEASE_TABLET | Freq: Once | ORAL | Status: AC
  Administered 2017-07-15: 40 meq via ORAL
  Filled 2017-07-15: qty 2

## 2017-07-15 NOTE — Progress Notes (Signed)
PROGRESS NOTE  Nash Mantisrecious G Chamblin ZOX:[id number] DOB: 11/28/1960 DOA: 07/11/2017 PCP: [place], Betty G, MD   LOS: 3 days   Brief Narrative / Interim history: 57 year old female with history of diastolic CHF, OSA, diabetes mellitus, hypertension who was brought to the ED due to shortness of breath and progressive fluid overload, requiring BiPAP in the ED for hypoxic respiratory failure.  She was started on IV diuresis.  Assessment & Plan: Principal Problem:   Acute respiratory failure with hypoxia (HCC) Active Problems:   Acute on chronic diastolic CHF (congestive heart failure) (HCC)   Diabetes mellitus type 2 in obese (HCC)   OSA (obstructive sleep apnea)   Essential hypertension   Acute hypoxic respiratory failure due to pulmonary edema due to acute on chronic diastolic CHF -Continue IV Lasix twice daily -Weight improved from 295 on admission to 288 this morning -respiratory status improved, still with pitting edema, as renal function is stable continue diuresis hopefully she will be ready for po lasix and home dc tomorrow.  -continue compression stockings   Hypokalemia -due to Lasix, replete and recheck in am   Hypertension -Continue Coreg, lisinopril -Blood pressure 133/96, stable   Type 2 diabetes mellitus -Continue sliding scale, CBG is within acceptable range 120-200  Polycythemia -Follow CBC, Hb stable @ 15 today   Active sleep apnea -Does not have a CPAP but has a positive study a few years ago, will consult care management -Consulted care management, apparently study has been 2 years ago and will need repeat study to qualify for CPAP   Morbid obesity -counseled    DVT prophylaxis: Lovenox Code Status: Full code Family Communication: no family at bedside Disposition Plan: home 1 day   Consultants:   None   Procedures:   None   Antimicrobials:  Doxycycline 4/4 >>    Subjective: -still has leg swelling, breathing better. No chest pain, no  abdominal pain, nausea/vomiting   Objective: Vitals:   07/14/17 1753 07/15/17 0046 07/15/17 0500 07/15/17 0834  BP: 138/78 (!) 133/96  129/74  Pulse:  80  82  Resp:      Temp:  98.4 F (36.9 C)  98.7 F (37.1 C)  TempSrc:  Oral  Oral  SpO2: 96% 92%  95%  Weight:   130.9 kg (288 lb 9.6 oz)   Height:        Intake/Output Summary (Last 24 hours) at 07/15/2017 1050 Last data filed at 07/15/2017 0058 Gross per 24 hour  Intake 702 ml  Output 300 ml  Net 402 ml   Filed Weights   07/14/17 0200 07/14/17 0728 07/15/17 0500  Weight: 134.1 kg (295 lb 10.2 oz) 131.4 kg (289 lb 11.2 oz) 130.9 kg (288 lb 9.6 oz)    Examination:  Constitutional: NAD Eyes:  No scleral icterus ENMT: mmm Respiratory: CTA, overall decreased breath sounds due to body habitus   Cardiovascular: RRR without MRG, 1+ pitting LE edema  Abdomen: soft, NT, ND, BS +  Musculoskeletal: no clubbing / cyanosis. Skin: no rashes seen   Data Reviewed: I have independently reviewed following labs and imaging studies   CBC: Recent Labs  Lab 07/11/17 2129 07/12/17 0416 07/15/17 0729  WBC 18.6* 17.6* 10.3  NEUTROABS 9.8*  --   --   HGB 17.6* 15.8* 15.0  HCT 54.3* 48.7* 46.3*  MCV 83.3 83.8 83.3  PLT 256 225 254   Basic Metabolic Panel: Recent Labs  Lab 07/11/17 2129 07/12/17 1228 07/13/17 1623 07/15/17 0729  NA 137 140 140  140  K 4.4 3.8 4.1 3.4*  CL 102 101 101 99*  CO2 18* 25 29 27   GLUCOSE 206* 154* 124* 172*  BUN 17 17 22* 17  CREATININE 1.32* 1.22* 1.15* 1.03*  CALCIUM 9.6 9.2 9.4 9.8  MG  --  2.0  --   --    GFR: Estimated Creatinine Clearance: 82 mL/min (A) (by C-G formula based on SCr of 1.03 mg/dL (H)). Liver Function Tests: No results for input(s): AST, ALT, ALKPHOS, BILITOT, PROT, ALBUMIN in the last 168 hours. No results for input(s): LIPASE, AMYLASE in the last 168 hours. No results for input(s): AMMONIA in the last 168 hours. Coagulation Profile: No results for input(s): INR, PROTIME  in the last 168 hours. Cardiac Enzymes: Recent Labs  Lab 07/12/17 0416 07/12/17 1228  TROPONINI 0.03* <0.03   BNP (last 3 results) Recent Labs    05/16/17 0955  PROBNP 83.0   HbA1C: No results for input(s): HGBA1C in the last 72 hours. CBG: Recent Labs  Lab 07/14/17 0828 07/14/17 1236 07/14/17 1653 07/14/17 2037 07/15/17 0829  GLUCAP 237* 141* 126* 225* 208*   Lipid Profile: No results for input(s): CHOL, HDL, LDLCALC, TRIG, CHOLHDL, LDLDIRECT in the last 72 hours. Thyroid Function Tests: No results for input(s): TSH, T4TOTAL, FREET4, T3FREE, THYROIDAB in the last 72 hours. Anemia Panel: No results for input(s): VITAMINB12, FOLATE, FERRITIN, TIBC, IRON, RETICCTPCT in the last 72 hours. Urine analysis:    Component Value Date/Time   COLORURINE YELLOW 05/06/2017 1815   APPEARANCEUR CLEAR 05/06/2017 1815   LABSPEC 1.013 05/06/2017 1815   PHURINE 5.0 05/06/2017 1815   GLUCOSEU NEGATIVE 05/06/2017 1815   HGBUR LARGE (A) 05/06/2017 1815   BILIRUBINUR NEGATIVE 05/06/2017 1815   KETONESUR NEGATIVE 05/06/2017 1815   PROTEINUR NEGATIVE 05/06/2017 1815   NITRITE NEGATIVE 05/06/2017 1815   LEUKOCYTESUR NEGATIVE 05/06/2017 1815   Sepsis Labs: Invalid input(s): PROCALCITONIN, LACTICIDVEN  Recent Results (from the past 240 hour(s))  MRSA PCR Screening     Status: None   Collection Time: 07/12/17  4:23 PM  Result Value Ref Range Status   MRSA by PCR NEGATIVE NEGATIVE Final    Comment:        The GeneXpert MRSA Assay (FDA approved for NASAL specimens only), is one component of a comprehensive MRSA colonization surveillance program. It is not intended to diagnose MRSA infection nor to guide or monitor treatment for MRSA infections. Performed at [facility], [address], [place], [place] [address]       Radiology Studies: No results found.   Scheduled Meds: . aspirin EC  81 mg Oral Daily  . carvedilol  12.5 mg Oral BID WC  . doxycycline  100 mg Oral  Q12H  . enoxaparin (LOVENOX) injection  40 mg Subcutaneous Daily  . furosemide  60 mg Intravenous Q12H  . insulin aspart  0-9 Units Subcutaneous TID WC  . lisinopril  20 mg Oral Daily  . multivitamin with minerals  1 tablet Oral Daily   Continuous Infusions: . nitroGLYCERIN Stopped (07/12/17 6045)    Pamella Pert, MD, PhD [facility] Pager [phone]  If 7PM-7AM, please contact night-coverage [email or web address] Password [email or web address] 07/15/2017, 10:50 AM

## 2017-07-16 LAB — BASIC METABOLIC PANEL
Anion gap: 11 (ref 5–15)
BUN: 21 mg/dL — AB (ref 6–20)
CALCIUM: 10 mg/dL (ref 8.9–10.3)
CHLORIDE: 101 mmol/L (ref 101–111)
CO2: 26 mmol/L (ref 22–32)
Creatinine, Ser: 1.13 mg/dL — ABNORMAL HIGH (ref 0.44–1.00)
GFR calc Af Amer: >60 mL/min (ref >60)
GFR calc non Af Amer: 53 mL/min — ABNORMAL LOW (ref >60)
GLUCOSE: 179 mg/dL — AB (ref 65–99)
Potassium: 3.8 mmol/L (ref 3.5–5.1)
Sodium: 138 mmol/L (ref 135–145)

## 2017-07-16 LAB — GLUCOSE, CAPILLARY
Glucose-Capillary: 184 mg/dL — ABNORMAL HIGH (ref 65–99)
Glucose-Capillary: 142 mg/dL — ABNORMAL HIGH (ref 65–99)

## 2017-07-16 MED ORDER — LISINOPRIL 20 MG PO TABS: 20.0000 mg | ORAL_TABLET | Freq: Every day | ORAL | 0 refills | Status: DC

## 2017-07-16 MED ORDER — FUROSEMIDE 40 MG PO TABS: 40.0000 mg | ORAL_TABLET | Freq: Two times a day (BID) | ORAL | 0 refills | Status: DC

## 2017-07-16 MED ORDER — CARVEDILOL 12.5 MG PO TABS: 12.5000 mg | ORAL_TABLET | Freq: Two times a day (BID) | ORAL | 0 refills | Status: DC

## 2017-07-16 MED ORDER — POTASSIUM CHLORIDE ER 10 MEQ PO TBCR: 20.0000 meq | EXTENDED_RELEASE_TABLET | Freq: Every day | ORAL | 0 refills | Status: AC

## 2017-07-16 NOTE — Plan of Care (Signed)
Patient continues to progress 

## 2017-07-16 NOTE — Discharge Summary (Signed)
Physician Discharge Summary  [name]:[id number] DOB: [birth year] DOA: 07/11/2017  PCP: Martinique, Betty G, MD  Admit date: 07/11/2017 Discharge date: 07/16/2017  Admitted From: home Disposition:  Home   Recommendations for Outpatient Follow-up:  1. Follow up with PCP in 1-2 weeks  Home Health: none Equipment/Devices: none  Discharge Condition: stable CODE STATUS: Full code Diet recommendation: low salt, heart healthy  HPI: Per Dr. Glyn Ade, Robin Oliver is a 57 y.o. female with history of diastolic CHF last EF measured in January 2019 was 60-65% with grade 2 diastolic dysfunction, sleep apnea, diabetes mellitus type 2, hypertension presents to the ER because of worsening shortness of breath.  Patient states she has been having exertional symptoms for last few weeks but last evening after church she became acutely short of breath and was driving on the way back home and she decided to come to the ER.  Denies any chest pain.  Has been having some cough.  Patient states he recently changed her house and shortness of breath has become more worse. ED Course: In the ER patient was acutely short of breath and became bradycardic and was placed on BiPAP.  Patient was given Lasix 80 mg IV and the nitroglycerin started.  Following which patient symptoms dramatically improved.  Patient at this time is able to talk sentences and is planned to be weaned off the BiPAP.  Chest x-ray shows congestion versus pneumonia.  Since patient also has cough along with Lasix doxycycline started.  Pro calcitonin ordered.  Patient admitted for further management of acute respiratory failure.  Hospital Course: Acute hypoxic respiratory failure due to pulmonary edema due to acute on chronic diastolic CHF /possible pneumonia-patient was admitted to the hospital with significant hypoxia requiring BiPAP in the emergency room.  Chest x-ray on admission showed pulmonary vascular congestion versus  infiltrate/pneumonia.  She was started on IV Lasix with significant improvement in her respiratory status and she was able to be weaned off to room air.  Her weight is improved from 295 on admission to 280 on discharge, and her diuresis was transitioned to p.o. Lasix.  She was also given antibiotics for presumed pneumonia and finished a 5-day course while in the hospital.  Her renal function has remained stable with diuresis Hypokalemia -due to Lasix, was repleted in the hospital and will be discharged home on potassium supplementations.  She was advised to have a BMP follow-up in 3-4 days after discharge Hypertension -Continue Coreg, lisinopril, Lasix, blood pressure stable Type 2 diabetes mellitus -patient very determined to work on weight loss and dietary changes, outpatient follow-up Polycythemia -Follow CBC, Hb stable Active sleep apnea -Does not have a CPAP but has a positive study a few years ago, will consult care management, consulted care management, apparently study has been 2 years ago and will need repeat study to qualify for CPAP, this will be done as an outpatient Morbid obesity -counseled    Discharge Diagnoses:  Principal Problem:   Acute respiratory failure with hypoxia ([place]) Active Problems:   Acute on chronic diastolic CHF (congestive heart failure) ([place])   Diabetes mellitus type 2 in obese (HCC)   OSA (obstructive sleep apnea)   Essential hypertension     Discharge Instructions   Allergies as of 07/16/2017      Reactions   Peanut-containing Drug Products Anaphylaxis   Pt states her allergy is to [facility] nuts only-  She does eat other nuts Peanuts are legumes; states she CAN eat peanuts (??)  Pine Shortness Of Breath      Medication List    TAKE these medications   aspirin EC 81 MG tablet Take 81 mg by mouth daily.   blood glucose meter kit and supplies Kit Dispense based on patient and insurance preference. Use up to four times daily as directed. (FOR ICD-9  250.00, 250.01).   carvedilol 12.5 MG tablet Commonly known as:  COREG Take 1 tablet (12.5 mg total) by mouth 2 (two) times daily with a meal.   furosemide 40 MG tablet Commonly known as:  LASIX Take 1 tablet (40 mg total) by mouth 2 (two) times daily. What changed:  when to take this   lisinopril 20 MG tablet Commonly known as:  PRINIVIL,ZESTRIL Take 1 tablet (20 mg total) by mouth daily.   multivitamin with minerals Tabs tablet Take 1 tablet by mouth daily.   potassium chloride 10 MEQ tablet Commonly known as:  K-DUR Take 2 tablets (20 mEq total) by mouth daily.      Follow-up Information    Martinique, Betty G, MD Follow up.   Specialty:  Family Medicine Why:  Call provider to have them make referral to sleep study center for test so you can qualify for a CPAP machine. Contact information: [place] [address] [phone]           Consultations:  None   Procedures/Studies:  Dg Chest Port 1 View  Result Date: 07/11/2017 CLINICAL DATA:  Respiratory distress today. EXAM: PORTABLE CHEST 1 VIEW COMPARISON:  Chest radiograph May 06, 2017 FINDINGS: Similar cardiomegaly. Diffuse interstitial and alveolar airspace opacities new from prior examination. Small probable pleural effusions. No pneumothorax. Soft tissue planes and included osseous structures are unchanged. IMPRESSION: New interstitial alveolar airspace opacities seen with CHF and/or pneumonia. Small probable pleural effusions. Stable cardiomegaly. Electronically Signed   By: Elon Alas M.D.   On: 07/11/2017 21:51      Subjective: - no chest pain, shortness of breath, no abdominal pain, nausea or vomiting.   Discharge Exam: Vitals:   07/16/17 0824 07/16/17 0825  BP: 128/77 128/77  Pulse: 84 84  Resp:    Temp: 98 F (36.7 C)   SpO2: 98%     General: Pt is alert, awake, not in acute distress Cardiovascular: RRR, S1/S2 +, no rubs, no gallops Respiratory: CTA  bilaterally, no wheezing, no rhonchi Abdominal: Soft, NT, ND, bowel sounds + Extremities: trace edema, no cyanosis    The results of significant diagnostics from this hospitalization (including imaging, microbiology, ancillary and laboratory) are listed below for reference.     Microbiology: Recent Results (from the past 240 hour(s))  MRSA PCR Screening     Status: None   Collection Time: 07/12/17  4:23 PM  Result Value Ref Range Status   MRSA by PCR NEGATIVE NEGATIVE Final    Comment:        The GeneXpert MRSA Assay (FDA approved for NASAL specimens only), is one component of a comprehensive MRSA colonization surveillance program. It is not intended to diagnose MRSA infection nor to guide or monitor treatment for MRSA infections. Performed at [facility], [place] [address], [place], [place] [address]      Labs: BNP (last 3 results) Recent Labs    05/06/17 1809 07/11/17 2129  BNP 263.1* 939.0*   Basic Metabolic Panel: Recent Labs  Lab 07/11/17 2129 07/12/17 1228 07/13/17 1623 07/15/17 0729 07/16/17 0407  NA 137 140 140 140 138  K 4.4 3.8  4.1 3.4* 3.8  CL 102 101 101 99* 101  CO2 18* 25 29 27 26   GLUCOSE 206* 154* 124* 172* 179*  BUN 17 17 22* 17 21*  CREATININE 1.32* 1.22* 1.15* 1.03* 1.13*  CALCIUM 9.6 9.2 9.4 9.8 10.0  MG  --  2.0  --   --   --    Liver Function Tests: No results for input(s): AST, ALT, ALKPHOS, BILITOT, PROT, ALBUMIN in the last 168 hours. No results for input(s): LIPASE, AMYLASE in the last 168 hours. No results for input(s): AMMONIA in the last 168 hours. CBC: Recent Labs  Lab 07/11/17 2129 07/12/17 0416 07/15/17 0729  WBC 18.6* 17.6* 10.3  NEUTROABS 9.8*  --   --   HGB 17.6* 15.8* 15.0  HCT 54.3* 48.7* 46.3*  MCV 83.3 83.8 83.3  PLT 256 225 254   Cardiac Enzymes: Recent Labs  Lab 07/12/17 0416 07/12/17 1228  TROPONINI 0.03* <0.03   BNP: Invalid input(s): POCBNP CBG: Recent Labs  Lab 07/15/17 1133  07/15/17 1646 07/15/17 2033 07/16/17 0754 07/16/17 1130  GLUCAP 151* 187* 175* 184* 142*   D-Dimer No results for input(s): DDIMER in the last 72 hours. Hgb A1c No results for input(s): HGBA1C in the last 72 hours. Lipid Profile No results for input(s): CHOL, HDL, LDLCALC, TRIG, CHOLHDL, LDLDIRECT in the last 72 hours. Thyroid function studies No results for input(s): TSH, T4TOTAL, T3FREE, THYROIDAB in the last 72 hours.  Invalid input(s): FREET3 Anemia work up No results for input(s): VITAMINB12, FOLATE, FERRITIN, TIBC, IRON, RETICCTPCT in the last 72 hours. Urinalysis    Component Value Date/Time   COLORURINE YELLOW 05/06/2017 1815   APPEARANCEUR CLEAR 05/06/2017 1815   LABSPEC 1.013 05/06/2017 1815   PHURINE 5.0 05/06/2017 1815   GLUCOSEU NEGATIVE 05/06/2017 1815   HGBUR LARGE (A) 05/06/2017 1815   BILIRUBINUR NEGATIVE 05/06/2017 1815   KETONESUR NEGATIVE 05/06/2017 1815   PROTEINUR NEGATIVE 05/06/2017 1815   NITRITE NEGATIVE 05/06/2017 1815   LEUKOCYTESUR NEGATIVE 05/06/2017 1815   Sepsis Labs Invalid input(s): PROCALCITONIN,  WBC,  LACTICIDVEN   Time coordinating discharge: 25 minutes  SIGNED:  Marzetta Board, MD  [facility] 07/16/2017, 12:35 PM Pager [phone]  If 7PM-7AM, please contact night-coverage [email or web address] Password [email or web address]

## 2017-07-16 NOTE — Progress Notes (Signed)
HPI:   Robin Oliver is a 57 y.o. female, who is here today to follow on recent hospitalization. She was hospitalized from 07/11/2017 to 07/16/2017. She presented to the ER complaining of worsening dyspnea.  Acute on chronic diastolic CHF with pulmonary edema and acute hypoxic respiratory failure. She was treated with IV Lasix and antibiotics for presumed pneumonia.  Lab Results  Component Value Date   WBC 10.3 07/15/2017   HGB 15.0 07/15/2017   HCT 46.3 (H) 07/15/2017   MCV 83.3 07/15/2017   PLT 254 07/15/2017   CXR 07/11/17: New interstitial alveolar airspace opacities seen with CHF and/or pneumonia. Small probable pleural effusions.  Stable cardiomegaly. She thinks acute illness was caused by medication that was delivered by [facility]. She went back to [facility] for her medication because she never had any problem while she was doing so.  She is not weighing yet.Planning on picking up scale today. She denies orthopnea or PND, she sleeps omn a recliner because she feels more comfortable doing so due to OSA.   Hypokalemia: Discharged on KCL 10 meq 2 tabs daily but she did not start it.  HTN: She is on Coreg 12.5 mg bid and Lisinopril 20 mg daily. She would like to stop Lisinopril. She has not noted side effects.   Lab Results  Component Value Date   CREATININE 1.13 (H) 07/16/2017   BUN 21 (H) 07/16/2017   NA 138 07/16/2017   K 3.8 07/16/2017   CL 101 07/16/2017   CO2 26 07/16/2017    OSA: Diagnosed a few years ago, she is not wearing CPAP at home.  Sleep study 2 times at [facility] but her insurance did not cover CPAP machine.    DM II:  She is not checking her BS's.  Lab Results  Component Value Date   HGBA1C 8.6 (H) 05/08/2017   Denies abdominal pain,nausea,vomiting, polydipsia,polyuria, or polyphagia.   Last seen on 05/16/17,when she established care.  Last visit she was not interested in cardiology referral until she was able to do some resource  and learned about their providers "back ground." She would like a female provider.  She does not believe she has DM II and she is not interested in starting medication. She is trying to follow a similar diet she was while she was in the hospital.  Planning on starting exercising: Walking the treadmill 2-3 times per week and then swimming.  She is back to her baseline, during hospitalization she was walking daily.   Review of Systems  Constitutional: Negative for activity change, appetite change, fatigue and fever.  HENT: Negative for nosebleeds and sore throat.   Eyes: Negative for redness and visual disturbance.  Respiratory: Negative for cough, shortness of breath and wheezing.   Cardiovascular: Negative for chest pain, palpitations and leg swelling.  Gastrointestinal: Negative for abdominal pain, nausea and vomiting.       Negative for changes in bowel habits.  Endocrine: Negative for polydipsia, polyphagia and polyuria.  Genitourinary: Negative for decreased urine volume, dysuria and hematuria.  Musculoskeletal: Negative for gait problem and myalgias.  Skin: Negative for rash and wound.  Neurological: Negative for syncope, weakness and headaches.      Current Outpatient Medications on File Prior to Visit  Medication Sig Dispense Refill  . aspirin EC 81 MG tablet Take 81 mg by mouth daily.     . carvedilol (COREG) 12.5 MG tablet Take 1 tablet (12.5 mg total) by mouth 2 (two) times daily  with a meal. 180 tablet 0  . furosemide (LASIX) 40 MG tablet Take 1 tablet (40 mg total) by mouth 2 (two) times daily. 180 tablet 0  . lisinopril (PRINIVIL,ZESTRIL) 20 MG tablet Take 1 tablet (20 mg total) by mouth daily. 90 tablet 0  . Multiple Vitamin (MULTIVITAMIN WITH MINERALS) TABS tablet Take 1 tablet by mouth daily.    . blood glucose meter kit and supplies KIT Dispense based on patient and insurance preference. Use up to four times daily as directed. (FOR ICD-9 250.00, 250.01). (Patient  not taking: Reported on 07/17/2017) 1 each 0  . potassium chloride (K-DUR) 10 MEQ tablet Take 2 tablets (20 mEq total) by mouth daily. (Patient not taking: Reported on 07/17/2017) 180 tablet 0   No current facility-administered medications on file prior to visit.      Past Medical History:  Diagnosis Date  . Allergy   . CHF (congestive heart failure) ([place])   . CHF (congestive heart failure) (HCC)    heart arrhythmia  . Hypertension   . OSA (obstructive sleep apnea)    Allergies  Allergen Reactions  . Peanut-Containing Drug Products Anaphylaxis    Pt states her allergy is to [facility] nuts only-  She does eat other nuts Peanuts are legumes; states she CAN eat peanuts (??)  . Pine Shortness Of Breath    Social History   Socioeconomic History  . Marital status: Single    Spouse name: Not on file  . Number of children: Not on file  . Years of education: Not on file  . Highest education level: Not on file  Occupational History  . Not on file  Social Needs  . Financial resource strain: Not on file  . Food insecurity:    Worry: Not on file    Inability: Not on file  . Transportation needs:    Medical: Not on file    Non-medical: Not on file  Tobacco Use  . Smoking status: Never Smoker  . Smokeless tobacco: Never Used  Substance and Sexual Activity  . Alcohol use: No    Frequency: Never  . Drug use: No  . Sexual activity: Never  Lifestyle  . Physical activity:    Days per week: Not on file    Minutes per session: Not on file  . Stress: Not on file  Relationships  . Social connections:    Talks on phone: Not on file    Gets together: Not on file    Attends religious service: Not on file    Active member of club or organization: Not on file    Attends meetings of clubs or organizations: Not on file    Relationship status: Not on file  Other Topics Concern  . Not on file  Social History Narrative  . Not on file    Vitals:   07/17/17 1354  BP: 130/84  Pulse: 84    Resp: 16  Temp: 98.9 F (37.2 C)  SpO2: 95%   Body mass index is 49.84 kg/m.   Physical Exam  Nursing note reviewed. Constitutional: She is oriented to person, place, and time. She appears well-developed. No distress.  HENT:  Head: Normocephalic and atraumatic.  Mouth/Throat: Oropharynx is clear and moist and mucous membranes are normal.  Eyes: Pupils are equal, round, and reactive to light. Conjunctivae are normal.  Neck: No JVD present.  Cardiovascular: Normal rate and regular rhythm.  No murmur heard. Pulses:      Dorsalis pedis pulses are 2+  on the right side, and 2+ on the left side.  Respiratory: Effort normal and breath sounds normal. No respiratory distress.  GI: Soft. She exhibits no mass. There is no hepatomegaly. There is no tenderness.  Musculoskeletal: She exhibits no edema.  Lymphadenopathy:    She has no cervical adenopathy.  Neurological: She is alert and oriented to person, place, and time. She has normal strength. Coordination normal.  Skin: Skin is warm. No erythema.  Psychiatric: She has a normal mood and affect.  Well groomed, good eye contact.    ASSESSMENT AND PLAN:  Ms. Nakyra was seen today for hospitalization follow-up.  Diagnoses and all orders for this visit:  Essential hypertension  BP adequately controlled. No changes in current management, strongly recommend continuing both medications (Lisinopril and Carvedilol). Recommend monitoring BP at home. Low salt diet to continue.  Acute on chronic diastolic CHF (congestive heart failure) (HCC)  Continue furosemide 40 mg twice daily and carvedilol 12.5 mg twice daily. She is not interested in following with cardiologist yet.  She would like a list of providers in the area so she can "learn about their backgrounds " before she arrange appointment and looking for female provider. Recommend daily weights. Continue low-salt diet.  OSA (obstructive sleep apnea)  Initially reluctant to have  appt arranged but after discussion of adverse effects of OSA, she finally agrees with seeing pulmonologist.  -     Ambulatory referral to Pulmonology  Hypokalemia  Recently checked and in normal range. Continue K+ rich diet for now since she did not start K+ supplementation.  Diabetes mellitus type 2 in obese [facility])  Educated about diagnostic criteria for DM 2. She does not think she has diabetes because she did not have it before. She Is not interested in pharmacologic treatment. Adverse effects of poorly controlled DM discussed.     Mekisha Bittel G. Martinique, MD  [facility]. [place] office.

## 2017-07-16 NOTE — Care Management Note (Addendum)
Case Management Note  Patient Details  Name: Robin Oliver MRN: [id number] Date of Birth: March 02, 1961  Subjective/Objective:      Consult for CPAP             Action/Plan: Hospital non-longer provides CPAP machines; Patient would need to have a Pulmonology referral-will notify attending.  Patient with discharge orders home today. No discharge needs noted.  Expected Discharge Date:  07/16/17               Expected Discharge Plan:  Home/Self Care  Discharge planning Services  CM Consult  DME Arranged: N/A   DME Agency:   N/A  Status of Service:  Completed, signed off  Yancey FlemingsKimberly R Breiona Couvillon, RN  Nurse Case Manager [place] 07/16/2017, 9:48 AM

## 2017-07-16 NOTE — Discharge Instructions (Signed)
Follow with Robin Oliver, Robin G, MD in 5-7 days  Please get a complete blood count and chemistry panel checked by your Primary MD at your next visit, and again as instructed by your Primary MD. Please get your medications reviewed and adjusted by your Primary MD.  Please request your Primary MD to go over all Hospital Tests and Procedure/Radiological results at the follow up, please get all Hospital records sent to your Prim MD by signing hospital release before you go home.  If you had Pneumonia of Lung problems at the Hospital: Please get a 2 view Chest X ray done in 6-8 weeks after hospital discharge or sooner if instructed by your Primary MD.  If you have Congestive Heart Failure: Please call your Cardiologist or Primary MD anytime you have any of the following symptoms:  1) 3 pound weight gain in 24 hours or 5 pounds in 1 week  2) shortness of breath, with or without a dry hacking cough  3) swelling in the hands, feet or stomach  4) if you have to sleep on extra pillows at night in order to breathe  Follow cardiac low salt diet and 1.5 lit/day fluid restriction.  If you have diabetes Accuchecks 4 times/day, Once in AM empty stomach and then before each meal. Log in all results and show them to your primary doctor at your next visit. If any glucose reading is under 80 or above 300 call your primary MD immediately.  If you have Seizure/Convulsions/Epilepsy: Please do not drive, operate heavy machinery, participate in activities at heights or participate in high speed sports until you have seen by Primary MD or a Neurologist and advised to do so again.  If you had Gastrointestinal Bleeding: Please ask your Primary MD to check a complete blood count within one week of discharge or at your next visit. Your endoscopic/colonoscopic biopsies that are pending at the time of discharge, will also need to followed by your Primary MD.  Get Medicines reviewed and adjusted. Please take all your  medications with you for your next visit with your Primary MD  Please request your Primary MD to go over all hospital tests and procedure/radiological results at the follow up, please ask your Primary MD to get all Hospital records sent to his/her office.  If you experience worsening of your admission symptoms, develop shortness of breath, life threatening emergency, suicidal or homicidal thoughts you must seek medical attention immediately by calling 911 or calling your MD immediately  if symptoms less severe.  You must read complete instructions/literature along with all the possible adverse reactions/side effects for all the Medicines you take and that have been prescribed to you. Take any new Medicines after you have completely understood and accpet all the possible adverse reactions/side effects.   Do not drive or operate heavy machinery when taking Pain medications.   Do not take more than prescribed Pain, Sleep and Anxiety Medications  Special Instructions: If you have smoked or chewed Tobacco  in the last 2 yrs please stop smoking, stop any regular Alcohol  and or any Recreational drug use.  Wear Seat belts while driving.  Please note You were cared for by a hospitalist during your hospital stay. If you have any questions about your discharge medications or the care you received while you were in the hospital after you are discharged, you can call the unit and asked to speak with the hospitalist on call if the hospitalist that took care of you is not available.  Once you are discharged, your primary care physician will handle any further medical issues. Please note that NO REFILLS for any discharge medications will be authorized once you are discharged, as it is imperative that you return to your primary care physician (or establish a relationship with a primary care physician if you do not have one) for your aftercare needs so that they can reassess your need for medications and monitor your  lab values.  You can reach the hospitalist office at phone [phone] or fax [phone]   If you do not have a primary care physician, you can call [phone] for a physician referral.  Activity: As tolerated with Full fall precautions use walker/cane & assistance as needed  Diet: low salt, heart healthy  Disposition Home

## 2017-07-17 ENCOUNTER — Telehealth: Payer: Self-pay | Admitting: Family Medicine

## 2017-07-17 ENCOUNTER — Ambulatory Visit (INDEPENDENT_AMBULATORY_CARE_PROVIDER_SITE_OTHER): Payer: Medicare HMO | Admitting: Family Medicine

## 2017-07-17 ENCOUNTER — Encounter: Payer: Self-pay | Admitting: Family Medicine

## 2017-07-17 VITALS — BP 130/84 | HR 84 | Temp 98.9°F | Resp 16 | Ht 64.0 in | Wt 290.4 lb

## 2017-07-17 DIAGNOSIS — I5033 Acute on chronic diastolic (congestive) heart failure: Secondary | ICD-10-CM | POA: Diagnosis not present

## 2017-07-17 DIAGNOSIS — E1169 Type 2 diabetes mellitus with other specified complication: Secondary | ICD-10-CM | POA: Diagnosis not present

## 2017-07-17 DIAGNOSIS — I10 Essential (primary) hypertension: Secondary | ICD-10-CM

## 2017-07-17 DIAGNOSIS — E669 Obesity, unspecified: Secondary | ICD-10-CM

## 2017-07-17 DIAGNOSIS — G4733 Obstructive sleep apnea (adult) (pediatric): Secondary | ICD-10-CM

## 2017-07-17 DIAGNOSIS — E876 Hypokalemia: Secondary | ICD-10-CM

## 2017-07-17 NOTE — Telephone Encounter (Signed)
I left a voice message for pt to return my call.  

## 2017-07-17 NOTE — Patient Instructions (Addendum)
A few things to remember from today's visit:   Hypertensive heart disease with heart failure (HCC)  Acute on chronic diastolic CHF (congestive heart failure) (HCC)  OSA (obstructive sleep apnea) - Plan: Ambulatory referral to Pulmonology  Hypokalemia  Cardiologist females: Dr Delton SeeNelson or Dr Mayford Knifeurner.   Please be sure medication list is accurate. If a new problem present, please set up appointment sooner than planned today.

## 2017-07-17 NOTE — Telephone Encounter (Signed)
Transition Care Management Follow-up Telephone Call  Robin Oliver ZOX:[id number] DOB: 1961/03/17 DOA: 07/11/2017  PCP: SwazilandJordan, Betty G, MD  Admit date: 07/11/2017 Discharge date: 07/16/2017  Admitted From: home Disposition:  Home   Recommendations for Outpatient Follow-up:  1. Follow up with PCP in 1-2 weeks  Home Health: none Equipment/Devices: none  Discharge Condition: stable CODE STATUS: Full code Diet recommendation: low salt, heart healthy  HPI: Per Dr. Gilford SilviusKakrakandi, Robin G Dancyis a 57 y.o.femalewithhistory of diastolic CHF last EF measured in January 2019 was 60-65% with grade 2 diastolic dysfunction, sleep apnea, diabetes mellitus type 2, hypertension presents to the ER because of worsening shortness of breath.Patient states she has been having exertional symptoms for last few weeks but last evening after church she became acutely short of breath and was driving on the way back home and she decided to come to the ER. Denies any chest pain. Has been having some cough. Patient states he recently changed her house and shortness of breath has become more worse. ED Course:In the ER patient was acutely short of breath and became bradycardic and was placed on BiPAP. Patient was given Lasix 80 mg IV and the nitroglycerin started. Following which patient symptoms dramatically improved. Patient at this time is able to talk sentences and is planned to be weaned off the BiPAP. Chest x-ray shows congestion versus pneumonia. Since patient also has cough along with Lasix doxycycline started. Pro calcitonin ordered. Patient admitted for further management of acute respiratory failure.    How have you been since you were released from the hospital? "better"   Do you understand why you were in the hospital? yes   Do you understand the discharge instructions? yes   Where were you discharged to? Home  Items Reviewed:  Medications reviewed: yes  Allergies  reviewed: yes  Dietary changes reviewed: yes  Referrals reviewed: yes   Functional Questionnaire:   Activities of Daily Living (ADLs):   She states they are independent in the following: ambulation, bathing and hygiene, feeding, continence, grooming, toileting and dressing States they require assistance with the following: none   Any transportation issues/concerns?: no   Any patient concerns? no   Confirmed importance and date/time of follow-up visits scheduled yes  Provider Appointment booked with Dr. SwazilandJordan 07/17/2017 Tuesday 2:00 pm  Confirmed with patient if condition begins to worsen call PCP or go to the ER.  Patient was given the office number and encouraged to call back with question or concerns.  : yes

## 2017-08-02 ENCOUNTER — Telehealth: Payer: Self-pay | Admitting: Family Medicine

## 2017-08-02 NOTE — Telephone Encounter (Signed)
Copied from CRM [phone]. Topic: General - Other >> Aug 02, 2017  6:48 AM Gerrianne ScalePayne, Angela L wrote: Reason for CRM: patient calling stating that she would like for Dr SwazilandJordan to give her a call about her living situation with people smoking around her apartment and it keeps her up to where she can't sleep at night she sleeps in her car so that she can breath due to the smoke that comes into her apartment she also states that the two medicines carvedilol (COREG) 12.5 MG tablet furosemide (LASIX) 40 MG tablet makes her have dry mouth she has been taking Halls cough drop to try and help with the dry mouth

## 2017-08-02 NOTE — Telephone Encounter (Signed)
Sent to PCP to advise 

## 2017-08-03 NOTE — Telephone Encounter (Signed)
I am not sure what I can do about "living situation."  If possible, she needs to complain with her landlord. We could provide a letter stating that exposure to fumes from tobacco are affecting her quality of life. Furosemide could be causing dry mouth.  This medication is very important to treat her heart and HTN, she could try to alternate between 40 and 20 mg, splitting the tablet in half every other day. For now no changes in carvedilol.  Thanks, BJ

## 2017-08-06 NOTE — Telephone Encounter (Signed)
Left message to give clinic a call back. 

## 2017-08-07 ENCOUNTER — Encounter: Payer: Self-pay | Admitting: *Deleted

## 2017-08-07 NOTE — Telephone Encounter (Signed)
Left message to give me a call back.

## 2017-08-07 NOTE — Telephone Encounter (Signed)
Spoke with patient and gave her instructions per Dr. Swaziland. Patient verbalized understanding. Patient stated that she does not feel comfortable changing medications at this time and that she would like the letter and would pick it up when at her visit on 08/17/17.

## 2017-08-16 NOTE — Progress Notes (Signed)
HPI:   [name] is a 57 y.o. female, who is here today for a month follow up.   She was last seen on 07/17/17 for hospital F/U. Hx of HF, she has appt with cardiologist and pulmonologist in the next couple weeks.  She is now getting her medications from [facility] and feeling "wonderful." She is now working part time.  She has been exercising regularly , swimming for the [ast 3 days in the row and having so symptoms upon doing so. Endurance is improving. She is also trying to eat healthier.  She is weighing daily and has not noted changes.  She is on Furosemide 40 mg bid.  She is still sleeping on a recliner, cannot sleep flat in bed , which she attributes to OSA.  She has appt with Dr Robin Oliver, pulmonologist for OSA  In a couple weeks.  -C/O rhinorrhea and post nasal drainage ,worse in the morning and at night. No sore throat,chills,or fever. Hx of allergies. She is taking Loratadine 10 mg daily.  Symptoms exacerbated by exposure to second hand smoking, neighbors smoking,she can smell it in her apartment.   Diabetes Mellitus II:   She has refused medication,she does not believe she has DM.   Checking BS's : She is not checking it.  She has not noted abdominal pain, nausea, vomiting, polydipsia, polyuria, or polyphagia. No numbness, tingling, or burning.     Lab Results  Component Value Date   CREATININE 1.13 (H) 07/16/2017   BUN 21 (H) 07/16/2017   NA 138 07/16/2017   K 3.8 07/16/2017   CL 101 07/16/2017   CO2 26 07/16/2017    Lab Results  Component Value Date   HGBA1C 8.6 (H) 05/08/2017   Lab Results  Component Value Date   MICROALBUR 1.8 05/16/2017    HTN: She is on Coreg 12.5 mg bid and Lisinopril 20 mg daily.  Denies severe/frequent headache, visual changes, chest pain, dyspnea, palpitation, claudication, focal weakness, or edema.  Last OV she agreed with referral to pulmonologist for OSA.   Review of Systems  Constitutional:  Negative for activity change, appetite change, fatigue and fever.  HENT: Negative for mouth sores, nosebleeds and trouble swallowing.   Eyes: Negative for redness and visual disturbance.  Respiratory: Negative for cough, shortness of breath and wheezing.   Cardiovascular: Negative for chest pain, palpitations and leg swelling.  Gastrointestinal: Negative for abdominal pain, nausea and vomiting.       Negative for changes in bowel habits.  Endocrine: Negative for cold intolerance, heat intolerance, polydipsia, polyphagia and polyuria.  Genitourinary: Negative for decreased urine volume, dysuria and hematuria.  Musculoskeletal: Negative for gait problem and myalgias.  Skin: Negative for rash and wound.  Neurological: Negative for syncope, weakness, numbness and headaches.    Current Outpatient Medications on File Prior to Visit  Medication Sig Dispense Refill  . aspirin EC 81 MG tablet Take 81 mg by mouth daily.     . blood glucose meter kit and supplies KIT Dispense based on patient and insurance preference. Use up to four times daily as directed. (FOR ICD-9 250.00, 250.01). 1 each 0  . carvedilol (COREG) 12.5 MG tablet Take 1 tablet (12.5 mg total) by mouth 2 (two) times daily with a meal. 180 tablet 0  . furosemide (LASIX) 40 MG tablet Take 1 tablet (40 mg total) by mouth 2 (two) times daily. 180 tablet 0  . lisinopril (PRINIVIL,ZESTRIL) 20 MG tablet Take 1 tablet (20  mg total) by mouth daily. 90 tablet 0  . Multiple Vitamin (MULTIVITAMIN WITH MINERALS) TABS tablet Take 1 tablet by mouth daily.    . potassium chloride (K-DUR) 10 MEQ tablet Take 2 tablets (20 mEq total) by mouth daily. (Patient taking differently: Take 20 mEq by mouth daily. ) 180 tablet 0   No current facility-administered medications on file prior to visit.      Past Medical History:  Diagnosis Date  . Allergy   . CHF (congestive heart failure) ([place])   . CHF (congestive heart failure) (H[facility])    heart arrhythmia  .  Hypertension   . OSA (obstructive sleep apnea)    Allergies  Allergen Reactions  . Peanut-Containing Drug Products Anaphylaxis    Pt states her allergy is to [facility] nuts only-  She does eat other nuts Peanuts are legumes; states she CAN eat peanuts (??)  . Pine Shortness Of Breath    Social History   Socioeconomic History  . Marital status: Single    Spouse name: Not on file  . Number of children: Not on file  . Years of education: Not on file  . Highest education level: Not on file  Occupational History  . Not on file  Social Needs  . Financial resource strain: Not on file  . Food insecurity:    Worry: Not on file    Inability: Not on file  . Transportation needs:    Medical: Not on file    Non-medical: Not on file  Tobacco Use  . Smoking status: Never Smoker  . Smokeless tobacco: Never Used  Substance and Sexual Activity  . Alcohol use: No    Frequency: Never  . Drug use: No  . Sexual activity: Never  Lifestyle  . Physical activity:    Days per week: Not on file    Minutes per session: Not on file  . Stress: Not on file  Relationships  . Social connections:    Talks on phone: Not on file    Gets together: Not on file    Attends religious service: Not on file    Active member of club or organization: Not on file    Attends meetings of clubs or organizations: Not on file    Relationship status: Not on file  Other Topics Concern  . Not on file  Social History Narrative  . Not on file    Vitals:   08/17/17 0954  BP: 124/82  Pulse: 81  Resp: 12  Temp: 97.9 F (36.6 C)  SpO2: 95%   Body mass index is 49.48 kg/m.   Wt Readings from Last 3 Encounters:  08/17/17 288 lb 4 oz (130.7 kg)  07/17/17 290 lb 6 oz (131.7 kg)  07/16/17 280 lb 9 oz (127.3 kg)    Physical Exam  Nursing note and vitals reviewed. Constitutional: She is oriented to person, place, and time. She appears well-developed. No distress.  HENT:  Head: Normocephalic and atraumatic.    Mouth/Throat: Oropharynx is clear and moist and mucous membranes are normal.  Eyes: Pupils are equal, round, and reactive to light. Conjunctivae are normal.  Cardiovascular: Normal rate and regular rhythm.  No murmur heard. Pulses:      Dorsalis pedis pulses are 2+ on the right side, and 2+ on the left side.  Respiratory: Effort normal and breath sounds normal. No respiratory distress.  GI: Soft. She exhibits no mass. There is no hepatomegaly. There is no tenderness.  Musculoskeletal: She exhibits edema (+2  pitting LE edema,bilateral.). She exhibits no tenderness.  Lymphadenopathy:    She has no cervical adenopathy.  Neurological: She is alert and oriented to person, place, and time. She has normal strength. Gait normal.  Skin: Skin is warm. No erythema.  Psychiatric: She has a normal mood and affect.  Well groomed, good eye contact.       ASSESSMENT AND PLAN:   Robin Oliver was seen today for a month follow-up.  Orders Placed This Encounter  Procedures  . Basic metabolic panel  . Hemoglobin A1c  . Fructosamine   Lab Results  Component Value Date   HGBA1C 8.2 (H) 08/17/2017   Lab Results  Component Value Date   CREATININE 1.06 08/17/2017   BUN 17 08/17/2017   NA 138 08/17/2017   K 3.7 08/17/2017   CL 101 08/17/2017   CO2 29 08/17/2017     Uncontrolled type 2 diabetes mellitus with hyperglycemia ([place])  HgA1C was not at goal. Further recommendations will be given according to HgA1C. Regular exercise and healthy diet with avoidance of added sugar food intake is an important part of treatment and recommended. Annual eye exam, periodic dental and foot care recommended. F/U in 4 months   Obesity, Class III, BMI 40-49.9 (morbid obesity) ([place])  We discussed benefits of wt loss as well as adverse effects of obesity. Consistency with healthy diet and physical activity recommended.   Hypertensive heart disease with heart failure (HCC) Adequately  controlled. No changes in current management. Low salt diet to continue. Eye exam recommended annually. F/U in 4 months, before if needed.    -Robin Oliver was advised to return sooner than planned today if new concerns arise.       Cloma Rahrig G. [place], MD  [facility]. [place] office.

## 2017-08-17 ENCOUNTER — Ambulatory Visit (INDEPENDENT_AMBULATORY_CARE_PROVIDER_SITE_OTHER): Payer: Medicare HMO | Admitting: Family Medicine

## 2017-08-17 ENCOUNTER — Encounter: Payer: Self-pay | Admitting: Family Medicine

## 2017-08-17 VITALS — BP 124/82 | HR 81 | Temp 97.9°F | Resp 12 | Ht 64.0 in | Wt 288.2 lb

## 2017-08-17 DIAGNOSIS — E1165 Type 2 diabetes mellitus with hyperglycemia: Secondary | ICD-10-CM | POA: Diagnosis not present

## 2017-08-17 DIAGNOSIS — I110 Hypertensive heart disease with heart failure: Secondary | ICD-10-CM

## 2017-08-17 LAB — BASIC METABOLIC PANEL
BUN: 17 mg/dL (ref 6–23)
CO2: 29 mEq/L (ref 19–32)
Calcium: 9.3 mg/dL (ref 8.4–10.5)
Chloride: 101 mEq/L (ref 96–112)
Creatinine, Ser: 1.06 mg/dL (ref 0.40–1.20)
GFR: 68.86 mL/min (ref >60.00)
GLUCOSE: 218 mg/dL — AB (ref 70–99)
Potassium: 3.7 mEq/L (ref 3.5–5.1)
Sodium: 138 mEq/L (ref 135–145)

## 2017-08-17 LAB — HEMOGLOBIN A1C: Hgb A1c MFr Bld: 8.2 % — ABNORMAL HIGH (ref 4.6–6.5)

## 2017-08-17 NOTE — Patient Instructions (Addendum)
A few things to remember from today's visit:   Uncontrolled type 2 diabetes mellitus with hyperglycemia (HCC) - Plan: Basic metabolic panel, Hemoglobin A1c, Fructosamine  Hypertensive heart disease with heart failure (HCC) - Plan: Basic metabolic panel   Please be sure medication list is accurate. If a new problem present, please set up appointment sooner than planned today.

## 2017-08-17 NOTE — Assessment & Plan Note (Signed)
Adequately controlled. No changes in current management. Low-salt diet to continue. Eye exam recommended annually. F/U in 4 months, before if needed.  

## 2017-08-18 ENCOUNTER — Encounter: Payer: Self-pay | Admitting: Family Medicine

## 2017-08-20 LAB — FRUCTOSAMINE: Fructosamine: 285 umol/L — ABNORMAL HIGH (ref 190–270)

## 2017-08-23 ENCOUNTER — Encounter: Payer: Self-pay | Admitting: Cardiology

## 2017-08-23 ENCOUNTER — Ambulatory Visit (INDEPENDENT_AMBULATORY_CARE_PROVIDER_SITE_OTHER): Payer: Medicare HMO | Admitting: Cardiology

## 2017-08-23 VITALS — BP 142/90 | HR 80 | Ht 64.0 in | Wt 291.0 lb

## 2017-08-23 DIAGNOSIS — I5032 Chronic diastolic (congestive) heart failure: Secondary | ICD-10-CM | POA: Diagnosis not present

## 2017-08-23 DIAGNOSIS — Z79899 Other long term (current) drug therapy: Secondary | ICD-10-CM

## 2017-08-23 MED ORDER — TORSEMIDE 20 MG PO TABS: 20.0000 mg | ORAL_TABLET | Freq: Two times a day (BID) | ORAL | 2 refills | Status: AC

## 2017-08-23 NOTE — Patient Instructions (Addendum)
Medication Instructions:   STOP TAKING LASIX  START TAIKNG TORSEMIDE  20 MG TWICE A DAY    If you need a refill on your cardiac medications before your next appointment, please call your pharmacy.  Labwork: RETURN IN 7 DAYS FOR BMET    Testing/Procedures:  NONE ORDERED  TODAY    Follow-Up:   IN 4 TO 6 WEEKS WITH SIMMONS   Any Other Special Instructions Will Be Listed Below (If Applicable).

## 2017-08-23 NOTE — Progress Notes (Signed)
08/23/2017 Robin Oliver   [birth year]  [id number]  Primary Physician Martinique, Betty G, MD Primary Cardiologist: New (Dr. Irish Lack DOD)  Reason for Visit/CC: New Pt Evaluation for Chronic Diastolic CHF  HPI:  Robin Oliver is a 57 y.o. female who is being seen today, as a new patient, for the evaluation of chronic diastolic HF.  She has significant cardiac history and previously followed at [facility]. Per records in [place], she has h/o cardiomyopathy, diagnosed in 2012, familial cardiomyopathy, type 2DM, obesity and OSA. Her family history is notable for CHF. Her mother died of CHF at age 10. Her brother had CHF but died from a brain aneurysm at age 11. Her sister died of CHF at age 43. She has 2 other living siblings with no history of heart failure.   In 2012, she had an EF of 10-20%. This was diagnosed while living in [place] (no records available at this time). She reports undergoing a stress test and was told that it was ok (suspect NICM). She established care at [facility] in 2016, after moving to [facility]. Echo was obtained and showed improved EF, normal, at 55%.   Pt now lives in the [place] area. She was recently admitted to [facility] in Feb for acute on chronic diastolic CHF and again in April for acute hypoxic respiratory failure due to acute pulmonary edema/ diastolic HF and possible PNA. She was treated by Internal Medicine. Cardiology was not consulted during admission. Per discharge summary, she was diuresis w/ IV Lasix. Her weight improved from 295 lb on admit to 280 lb by discharge. She was transitioned to oral lasix, 40 mg BID and also treated with Coreg and lisinopril. Echo at [facility] showed normal LVEF 60-65% with G2DD. Antibiotics were prescribed for PNA.   She is here today to establish cardiac care. She denies dyspnea, no chest pain. She does have 1+ bilateral LEE on exam. She feels that she is not responding well to PO Lasix. She notes abdominal distention. No orthopnea or PND.  She reports med compliance and adherence to low sodium diet. She checks her weight a few times a week but not on a daily basis. Her weight is up to 191 lb today (280 lb at hospital d/c). She had a f/u BMP post hospital on 08/17/17 that showed normal SCr at 1.06. K 3.7.   Cardiac Studies  2D Echo 05/08/17  Study Conclusions  - Left ventricle: The cavity size was normal. There was moderate   concentric hypertrophy. Systolic function was normal. The   estimated ejection fraction was in the range of 60% to 65%. Wall   motion was normal; there were no regional wall motion   abnormalities. Features are consistent with a pseudonormal left   ventricular filling pattern, with concomitant abnormal relaxation   and increased filling pressure (grade 2 diastolic dysfunction).   Doppler parameters are consistent with high ventricular filling   pressure. - Aortic valve: Transvalvular velocity was within the normal range.   There was no stenosis. There was no regurgitation. - Mitral valve: Transvalvular velocity was within the normal range.   There was no evidence for stenosis. There was no regurgitation. - Right ventricle: The cavity size was normal. Wall thickness was   normal. Systolic function was normal. - Tricuspid valve: There was no regurgitation. - Pericardium, extracardiac: A trivial pericardial effusion was   identified.  Complications:  Multiple attempts to educate the patient about, definity patient refused the use of Definity.  Current Meds  Medication Sig  . aspirin EC 81 MG tablet Take 81 mg by mouth daily.   . blood glucose meter kit and supplies KIT Dispense based on patient and insurance preference. Use up to four times daily as directed. (FOR ICD-9 250.00, 250.01).  . carvedilol (COREG) 12.5 MG tablet Take 1 tablet (12.5 mg total) by mouth 2 (two) times daily with a meal.  . lisinopril (PRINIVIL,ZESTRIL) 20 MG tablet Take 1 tablet (20 mg total) by mouth daily.  . Multiple  Vitamin (MULTIVITAMIN WITH MINERALS) TABS tablet Take 1 tablet by mouth daily.  . potassium chloride (K-DUR) 10 MEQ tablet Take 2 tablets (20 mEq total) by mouth daily.  . [DISCONTINUED] furosemide (LASIX) 40 MG tablet Take 1 tablet (40 mg total) by mouth 2 (two) times daily.   Allergies  Allergen Reactions  . Peanut-Containing Drug Products Anaphylaxis    Pt states her allergy is to [facility] nuts only-  She does eat other nuts Peanuts are legumes; states she CAN eat peanuts (??)  . Pine Shortness Of Breath   Past Medical History:  Diagnosis Date  . Allergy   . CHF (congestive heart failure) ([place])   . CHF (congestive heart failure) (H[facility])    heart arrhythmia  . Hypertension   . OSA (obstructive sleep apnea)    Family History  Problem Relation Age of Onset  . Congestive Heart Failure Mother   . Heart failure Mother   . Congestive Heart Failure Sister   . Congestive Heart Failure Brother    History reviewed. No pertinent surgical history. Social History   Socioeconomic History  . Marital status: Single    Spouse name: Not on file  . Number of children: Not on file  . Years of education: Not on file  . Highest education level: Not on file  Occupational History  . Not on file  Social Needs  . Financial resource strain: Not on file  . Food insecurity:    Worry: Not on file    Inability: Not on file  . Transportation needs:    Medical: Not on file    Non-medical: Not on file  Tobacco Use  . Smoking status: Never Smoker  . Smokeless tobacco: Never Used  Substance and Sexual Activity  . Alcohol use: No    Frequency: Never  . Drug use: No  . Sexual activity: Never  Lifestyle  . Physical activity:    Days per week: Not on file    Minutes per session: Not on file  . Stress: Not on file  Relationships  . Social connections:    Talks on phone: Not on file    Gets together: Not on file    Attends religious service: Not on file    Active member of club or organization: Not  on file    Attends meetings of clubs or organizations: Not on file    Relationship status: Not on file  . Intimate partner violence:    Fear of current or ex partner: Not on file    Emotionally abused: Not on file    Physically abused: Not on file    Forced sexual activity: Not on file  Other Topics Concern  . Not on file  Social History Narrative  . Not on file     Review of Systems: General: negative for chills, fever, night sweats or weight changes.  Cardiovascular: negative for chest pain, dyspnea on exertion, edema, orthopnea, palpitations, paroxysmal nocturnal dyspnea or shortness of breath Dermatological: negative for  rash Respiratory: negative for cough or wheezing Urologic: negative for hematuria Abdominal: negative for nausea, vomiting, diarrhea, bright red blood per rectum, melena, or hematemesis Neurologic: negative for visual changes, syncope, or dizziness All other systems reviewed and are otherwise negative except as noted above.   Physical Exam:  Blood pressure (!) 142/90, pulse 80, height 5' 4"  (1.626 m), weight 291 lb (132 kg).  General appearance: alert, cooperative, no distress and morbidly obese Neck: no carotid bruit and no JVD Lungs: clear to auscultation bilaterally Heart: regular rate and rhythm, S1, S2 normal, no murmur, click, rub or gallop Extremities: 1+ bilateral LEE Pulses: 2+ and symmetric Skin: Skin color, texture, turgor normal. No rashes or lesions Neurologic: Grossly normal  EKG not performed -- personally reviewed   ASSESSMENT AND PLAN:   1. Chronic Diastolic HF: recent echo showed normal LVEF and G2DD. She has had 2 admissions over the last 3 months for acute on chronic diastolic HF. At her last admission for CHF in April, she diuresed 15 lb, down for 295>>280 and was transitioned back to PO Lasix, 40 mg BID. However pt notes poor response to PO Lasix. She has 1+ bilateral LEE pitting edema on exam and weight is back up to 291 lb. She  denies dyspnea, orthopnea and PND. Lungs are CTAB. We will change from Lasix to Torsemide for better absorption (BMP 5/10 showed normal SCr at 1.0). We will get a f/u BMP in 7 days to recheck renal function and electrolytes. We also dicussed low sodium diet, and checking weight daily. Pt instructed to call our office if > 3 lb weight gain in 24 hrs or > 5 lb in 1 week. Continue Coreg and lisinopril for BP control. Will f/u in 4 weeks to check response to diuretic change.   2. H/o NICM: pt reports EF was 10% in 2012 (diagnosed in [place]). No records avalible. EF improved and back to normal. Continue Coreg and Lisinopril.   3. HTN: mildly elevated but also mildly volume overloaded. Suspect volume and BP may improve with Torsemide. W/u in 4 weeks. We discussed low sodium diet.    Pt case was discussed with Dr. Irish Lack, [place]. He also examined the patient and agrees with the assessment and plan outlined above.    Follow-Up w/ me in weeks to assess response to diuretic change. Repeat BMP in 7 days.   Caidynce Muzyka Ladoris Gene, MHS [facility] 08/23/2017 11:21 AM

## 2017-09-07 ENCOUNTER — Other Ambulatory Visit: Payer: Medicare HMO | Admitting: *Deleted

## 2017-09-07 DIAGNOSIS — Z79899 Other long term (current) drug therapy: Secondary | ICD-10-CM

## 2017-09-07 LAB — BASIC METABOLIC PANEL
BUN / CREAT RATIO: 16 (ref 9–23)
BUN: 18 mg/dL (ref 6–24)
CO2: 25 mmol/L (ref 20–29)
CREATININE: 1.12 mg/dL — AB (ref 0.57–1.00)
Calcium: 9.4 mg/dL (ref 8.7–10.2)
Chloride: 97 mmol/L (ref 96–106)
GFR calc Af Amer: 63 mL/min/{1.73_m2} (ref >59)
GFR, EST NON AFRICAN AMERICAN: 55 mL/min/{1.73_m2} — AB (ref >59)
Glucose: 160 mg/dL — ABNORMAL HIGH (ref 65–99)
Potassium: 3.5 mmol/L (ref 3.5–5.2)
SODIUM: 140 mmol/L (ref 134–144)

## 2017-09-14 ENCOUNTER — Encounter: Payer: Self-pay | Admitting: Pulmonary Disease

## 2017-09-14 ENCOUNTER — Ambulatory Visit (INDEPENDENT_AMBULATORY_CARE_PROVIDER_SITE_OTHER): Payer: Medicare HMO | Admitting: Pulmonary Disease

## 2017-09-14 VITALS — BP 152/96 | HR 94 | Wt 287.8 lb

## 2017-09-14 DIAGNOSIS — G4733 Obstructive sleep apnea (adult) (pediatric): Secondary | ICD-10-CM

## 2017-09-14 NOTE — Patient Instructions (Signed)
Will arrange for CPAP set up  Follow up in 2 months after getting CPAP 

## 2017-09-14 NOTE — Progress Notes (Signed)
[facility]  Chief Complaint  Patient presents with  . Sleep Consult    sleep apnea diagnosed but could not afford it, needs CPAP now, she has changed insurances    Vital signs: BP (!) 152/96 (BP Location: Left Arm, Cuff Size: Large)   Pulse 94   Wt 287 lb 12.8 oz (130.5 kg)   SpO2 91%   BMI 49.40 kg/m   History of Present Illness: Robin Oliver is a 57 y.o. female for evaluation of sleep problems.  She was diagnosed with combined CHF while living in [place].  She had sleep study there and at [facility].  Found to have severe sleep apnea.  Apparently her insurance at the time didn't approve CPAP set up.  She now has different insurance.    She still has snoring, and wakes up feeling choked.  She has to sleep in a recliner.  She can't breath if she lays flat.  She goes to sleep at 1 am.  She falls asleep in 20 minutes.  She wakes up 2 or 3 times to use the bathroom.  She gets out of bed at 6 am.  She feels tired in the morning.  She denies morning headache.  She does not use anything to help her fall sleep or stay awake.  She denies sleep walking, sleep talking, bruxism, or nightmares.  There is no history of restless legs.  She denies sleep hallucinations, sleep paralysis, or cataplexy.  The Epworth score is 14 out of 24.    Physical Exam:  General - pleasant Eyes - pupils reactive ENT - no sinus tenderness, no oral exudate, no LAN, MP 4 Cardiac - regular, no murmur Chest - no wheeze, rales Abd - soft, non tender Ext - no edema Skin - no rashes Neuro - normal strength Psych - normal mood  Discussion: She has snoring, sleep disruption, apnea, and daytime sleepiness.  She has hx of combined CHF and hypertension.  Previous sleep study showed severe obstructive sleep apnea.  We discussed how sleep apnea can affect various health problems, including risks for hypertension, cardiovascular disease, and diabetes.  We also discussed  how sleep disruption can increase risks for accidents, such as while driving.  Weight loss as a means of improving sleep apnea was also reviewed.  Additional treatment options discussed were CPAP therapy, oral appliance, and surgical intervention.  Assessment/Plan:  Obstructive sleep apnea. - will arrange for auto CPAP set up - explained she might need repeat home sleep study for insurance coverage of CPAP set up  Obesity. - encouraged her to keep up with weight loss efforts   Patient Instructions  Will arrange for CPAP set up  Follow up in 2 months after getting CPAP    Chesley Mires, MD [place] 09/14/2017, 10:03 AM  Flow Sheet  Sleep tests: PSG 07/30/15 >> AHI 80, SpO2 low 68%  Cardiac tests: Echo 05/08/17 >> EF 60 to 65%, grade 2 DD  Review of Systems: Constitutional: Negative for fever and unexpected weight change.  HENT: Positive for congestion and sneezing. Negative for dental problem, ear pain, nosebleeds, postnasal drip, rhinorrhea, sinus pressure, sore throat and trouble swallowing.   Eyes: Negative for redness and itching.  Respiratory: Positive for shortness of breath. Negative for cough, chest tightness and wheezing.   Cardiovascular: Positive for leg swelling. Negative for palpitations.  Gastrointestinal: Negative for nausea and vomiting.  Genitourinary: Negative for dysuria.  Musculoskeletal: Negative for joint swelling.  Skin: Negative  for rash.  Neurological: Negative for headaches.  Hematological: Does not bruise/bleed easily.  Psychiatric/Behavioral: Negative for dysphoric mood. The patient is not nervous/anxious.    Past Medical History: She  has a past medical history of Allergy, CHF (congestive heart failure) ([place]), CHF (congestive heart failure) ([place]), Hypertension, and OSA (obstructive sleep apnea).  Past Surgical History: She  has no past surgical history on file.  Family History: Her family history includes Congestive Heart  Failure in her brother, mother, and sister; Heart failure in her mother.  Social History: She  reports that she has never smoked. She has never used smokeless tobacco. She reports that she does not drink alcohol or use drugs.  Medications: Allergies as of 09/14/2017      Reactions   Peanut-containing Drug Products Anaphylaxis   Pt states her allergy is to [facility] nuts only-  She does eat other nuts Peanuts are legumes; states she CAN eat peanuts (??)   Pine Shortness Of Breath      Medication List        Accurate as of 09/14/17 10:03 AM. Always use your most recent med list.          aspirin EC 81 MG tablet Take 81 mg by mouth daily.   blood glucose meter kit and supplies Kit Dispense based on patient and insurance preference. Use up to four times daily as directed. (FOR ICD-9 250.00, 250.01).   carvedilol 12.5 MG tablet Commonly known as:  COREG Take 1 tablet (12.5 mg total) by mouth 2 (two) times daily with a meal.   lisinopril 20 MG tablet Commonly known as:  PRINIVIL,ZESTRIL Take 1 tablet (20 mg total) by mouth daily.   multivitamin with minerals Tabs tablet Take 1 tablet by mouth daily.   potassium chloride 10 MEQ tablet Commonly known as:  K-DUR Take 2 tablets (20 mEq total) by mouth daily.   torsemide 20 MG tablet Commonly known as:  DEMADEX Take 1 tablet (20 mg total) by mouth 2 (two) times daily.

## 2017-09-14 NOTE — Progress Notes (Signed)
   Subjective:    Patient ID: Robin Oliver, female    DOB: July 24, 1960, 57 y.o.   MRN: [id number]  HPI    Review of Systems  Constitutional: Negative for fever and unexpected weight change.  HENT: Positive for congestion and sneezing. Negative for dental problem, ear pain, nosebleeds, postnasal drip, rhinorrhea, sinus pressure, sore throat and trouble swallowing.   Eyes: Negative for redness and itching.  Respiratory: Positive for shortness of breath. Negative for cough, chest tightness and wheezing.   Cardiovascular: Positive for leg swelling. Negative for palpitations.  Gastrointestinal: Negative for nausea and vomiting.  Genitourinary: Negative for dysuria.  Musculoskeletal: Negative for joint swelling.  Skin: Negative for rash.  Neurological: Negative for headaches.  Hematological: Does not bruise/bleed easily.  Psychiatric/Behavioral: Negative for dysphoric mood. The patient is not nervous/anxious.        Objective:   Physical Exam        Assessment & Plan:

## 2017-09-21 ENCOUNTER — Encounter: Payer: Self-pay | Admitting: Cardiology

## 2017-10-04 ENCOUNTER — Ambulatory Visit (INDEPENDENT_AMBULATORY_CARE_PROVIDER_SITE_OTHER): Payer: Medicare HMO | Admitting: Cardiology

## 2017-10-04 ENCOUNTER — Encounter: Payer: Self-pay | Admitting: Cardiology

## 2017-10-04 VITALS — BP 134/82 | HR 82 | Ht 64.0 in | Wt 287.0 lb

## 2017-10-04 DIAGNOSIS — I5032 Chronic diastolic (congestive) heart failure: Secondary | ICD-10-CM

## 2017-10-04 NOTE — Progress Notes (Signed)
10/04/2017 Robin Oliver   1960/05/04  [id number]  Primary Physician [place], Betty G, MD Primary Cardiologist: Dr. Irish Lack (requesting change to a female MD)  Reason for Visit/CC: F/u for Diastolic HF.   HPI: [name] presents back to clinic for her second OV with [facility]. She was fist seen as a new pt for HF, in May 2019. She has significant cardiac history and previously followed at [facility]. Per records in [place], she has h/o cardiomyopathy, diagnosed in 2012,  type 2DM, obesity and OSA. Her family history is notable for CHF. Her mother died of CHF at age 69. Her brother had CHF but died from a brain aneurysm at age 57. Her sister died of CHF at age 30. She has 2 other living siblings with no history of heart failure.   In 2012, she had an EF of 10-20%. This was diagnosed while living in [place] (no records available at this time). She reports undergoing a stress test and was told that it was ok (suspect NICM). She established care at [facility] in 2016, after moving to [facility]. Echo was obtained and showed improved EF, normal, at 55%.   Pt now lives in the [place] area. She was recently admitted to [facility] in Feb for acute on chronic diastolic CHF and again in April for acute hypoxic respiratory failure due to acute pulmonary edema/ diastolic HF and possible PNA. She was treated by Internal Medicine. Cardiology was not consulted during admission. Per discharge summary, she was diuresis w/ IV Lasix. Her weight improved from 295 lb on admit to 280 lb by discharge. She was transitioned to oral lasix, 40 mg BID and also treated with Coreg and lisinopril. Echo at [facility] showed normal LVEF 60-65% with G2DD. Antibiotics were prescribed for PNA.   I evaluated her as a New Pt on 08/23/17. Dr. Irish Lack was DOD and also saw pt and weighed in on decision making. At that visit, her weight was up to 191 lb (previous discharge weight was 280 lb). Pt reported compliance with Lasix but poor UOP. She also  endorsed increased abdominal girth. We decided to change her diuretic from Lasix to Torsemide for better GI absorption. After change, we checked repeat labs and her renal function and K remained stable.   She is here again for f/u. She reports she is doing better. Weight has come down. Better UOP. No LEE or dyspnea. She is still struggling however with overall weight loss. She has been swimming for exercise. Has been trying to modify diet. She tries to avoid salt. She is interested in talking to her PCP about a dietitian/ nutritionist.   Current Meds  Medication Sig  . aspirin EC 81 MG tablet Take 81 mg by mouth daily.   . blood glucose meter kit and supplies KIT Dispense based on patient and insurance preference. Use up to four times daily as directed. (FOR ICD-9 250.00, 250.01).  . carvedilol (COREG) 12.5 MG tablet Take 1 tablet (12.5 mg total) by mouth 2 (two) times daily with a meal.  . lisinopril (PRINIVIL,ZESTRIL) 20 MG tablet Take 1 tablet (20 mg total) by mouth daily.  . Multiple Vitamin (MULTIVITAMIN WITH MINERALS) TABS tablet Take 1 tablet by mouth daily.  . potassium chloride (K-DUR) 10 MEQ tablet Take 2 tablets (20 mEq total) by mouth daily.  Marland Kitchen torsemide (DEMADEX) 20 MG tablet Take 1 tablet (20 mg total) by mouth 2 (two) times daily.   Allergies  Allergen Reactions  . Peanut-Containing Drug Products  Anaphylaxis    Pt states her allergy is to [facility] nuts only-  She does eat other nuts Peanuts are legumes; states she CAN eat peanuts (??)  . Pine Shortness Of Breath   Past Medical History:  Diagnosis Date  . Allergy   . CHF (congestive heart failure) ([place])   . CHF (congestive heart failure) ([facility])    heart arrhythmia  . Hypertension   . OSA (obstructive sleep apnea)    Family History  Problem Relation Age of Onset  . Congestive Heart Failure Mother   . Heart failure Mother   . Congestive Heart Failure Sister   . Congestive Heart Failure Brother    History reviewed. No  pertinent surgical history. Social History   Socioeconomic History  . Marital status: Single    Spouse name: Not on file  . Number of children: Not on file  . Years of education: Not on file  . Highest education level: Not on file  Occupational History  . Not on file  Social Needs  . Financial resource strain: Not on file  . Food insecurity:    Worry: Not on file    Inability: Not on file  . Transportation needs:    Medical: Not on file    Non-medical: Not on file  Tobacco Use  . Smoking status: Never Smoker  . Smokeless tobacco: Never Used  Substance and Sexual Activity  . Alcohol use: No    Frequency: Never  . Drug use: No  . Sexual activity: Never  Lifestyle  . Physical activity:    Days per week: Not on file    Minutes per session: Not on file  . Stress: Not on file  Relationships  . Social connections:    Talks on phone: Not on file    Gets together: Not on file    Attends religious service: Not on file    Active member of club or organization: Not on file    Attends meetings of clubs or organizations: Not on file    Relationship status: Not on file  . Intimate partner violence:    Fear of current or ex partner: Not on file    Emotionally abused: Not on file    Physically abused: Not on file    Forced sexual activity: Not on file  Other Topics Concern  . Not on file  Social History Narrative  . Not on file     Review of Systems: General: negative for chills, fever, night sweats or weight changes.  Cardiovascular: negative for chest pain, dyspnea on exertion, edema, orthopnea, palpitations, paroxysmal nocturnal dyspnea or shortness of breath Dermatological: negative for rash Respiratory: negative for cough or wheezing Urologic: negative for hematuria Abdominal: negative for nausea, vomiting, diarrhea, bright red blood per rectum, melena, or hematemesis Neurologic: negative for visual changes, syncope, or dizziness All other systems reviewed and are  otherwise negative except as noted above.   Physical Exam:  Blood pressure 134/82, pulse 82, height 5' 4"  (1.626 m), weight 287 lb (130.2 kg), SpO2 94 %.  General appearance: alert, cooperative, no distress and morbidly obese Neck: no carotid bruit and no JVD Lungs: clear to auscultation bilaterally Heart: regular rate and rhythm, S1, S2 normal, no murmur, click, rub or gallop Extremities: trace bilateral LEE Pulses: 2+ and symmetric Skin: Skin color, texture, turgor normal. No rashes or lesions Neurologic: Grossly normal  EKG not performed -- personally reviewed   ASSESSMENT AND PLAN:   1. Chronic Diastolic HF: volume improved  after change from Lasix to Torsemide. Appears euvolemic on exam. BP is controlled. Continue diuretic, BB and ACEi. We discussed low sodium diet and daily weights. She will call our office if > 3lb gain in 24 hrs or > 5 lb in 1 week.   2. H/o NICM: EF in 2012 was 10-20%, diagnosed while living in [place]. Reports normal cath. Recent echo showed normal EF and DD. Continue BB and ACEi.   3. Obesity: Body mass index is 49.26 kg/m. Main discussion and concern for pt today.  She has been swimming for exercise. Has been trying to modify diet. She tries to avoid salt. She is interested in talking to her PCP about a dietitian/ nutritionist, which I also encouraged.   4. OSA:  Pt notes PCP ordered new CPAP. Pt awaiting arrival.    Follow-Up in 6 months NOTE: Pt was assigned to Dr. Irish Lack (was DOD day of new Pt appt and Dr. Irish Lack also saw and examined pt), however she is very particular about medical providers and strongly prefers female providers only. We will try to reassign her to a female cardiologist as primary   [place] PA-C, MHS [facility] 10/04/2017 10:29 AM

## 2017-10-04 NOTE — Patient Instructions (Addendum)
Medication Instructions:   Your physician recommends that you continue on your current medications as directed. Please refer to the Current Medication list given to you today.    If you need a refill on your cardiac medications before your next appointment, please call your pharmacy.  Labwork: NONE ORDERED  TODAY    Testing/Procedures: NONE ORDERED  TODAY    Follow-Up: Your physician wants you to follow-up in:  IN  6  MONTHS WITH [facility]  You will receive a reminder letter in the mail two months in advance. If you don't receive a letter, please call our office to schedule the follow-up appointment.    Any Other Special Instructions Will Be Listed Below (If Applicable).  CHECK WEIGHT ONCE A WEEK  AND CONTAC CLINIC IF GAIN 5 LBS IN A WEEK

## 2017-11-09 ENCOUNTER — Other Ambulatory Visit: Payer: Self-pay | Admitting: Family Medicine

## 2017-11-09 NOTE — Telephone Encounter (Unsigned)
Copied from CRM [phone]. Topic: Quick Communication - Rx Refill/Question >> Nov 09, 2017  5:12 PM Floria RavelingStovall, Shana A wrote: Medication:  Medication   lisinopril (PRINIVIL,ZESTRIL) 20 MG tablet [[id number]][[id number]] ENDED  torsemide (DEMADEX) 20 MG tablet [[id number]][[id number]] carvedilol (COREG) 12.5 MG tablet [[id number]][[id number]]  ENDED  Has the patient contacted their pharmacy? No  (Agent: If no, request that the patient contact the pharmacy for the refill.) (Agent: If yes, when and what did the pharmacy advise?)  Preferred Pharmacy (with phone number or street name): [facility]   Agent: Please be advised that RX refills may take up to 3 business days. We ask that you follow-up with your pharmacy.

## 2017-11-09 NOTE — Telephone Encounter (Signed)
Pt requesting refill of Carvedilol 12.5mg  tab and Lisinopril 20mg  tab. Current prescriptions for both medications are expired.   Pt also requesting refill of Torsemide 20mg  tab, which was previously prescribed by a different provider.   LOV: 07/17/17 PCP: Dr. SwazilandJordan  [facility] [address]

## 2017-11-12 ENCOUNTER — Other Ambulatory Visit: Payer: Self-pay | Admitting: *Deleted

## 2017-11-12 DIAGNOSIS — I5033 Acute on chronic diastolic (congestive) heart failure: Secondary | ICD-10-CM

## 2017-11-12 DIAGNOSIS — I110 Hypertensive heart disease with heart failure: Secondary | ICD-10-CM

## 2017-11-12 MED ORDER — CARVEDILOL 12.5 MG PO TABS: 12.5000 mg | ORAL_TABLET | Freq: Two times a day (BID) | ORAL | 0 refills | Status: DC

## 2017-11-12 MED ORDER — LISINOPRIL 20 MG PO TABS: 20.0000 mg | ORAL_TABLET | Freq: Every day | ORAL | 0 refills | Status: DC

## 2017-11-12 MED ORDER — LISINOPRIL 20 MG PO TABS: 20.0000 mg | ORAL_TABLET | Freq: Every day | ORAL | 3 refills | Status: DC

## 2017-11-12 NOTE — Telephone Encounter (Signed)
Rx's for Lisinopril and Coreg sent to pharmacy. Is it okay to refill Torsemide 20 mg?

## 2017-11-14 ENCOUNTER — Other Ambulatory Visit: Payer: Self-pay | Admitting: Family Medicine

## 2017-11-14 NOTE — Telephone Encounter (Addendum)
It seems like torsemide is been filled by cardiologist. She had Rx sent to her pharmacy in 08/2017, #180 with 2 refills. So she should be fine until next follow-up.  Thanks, BJ

## 2017-11-23 ENCOUNTER — Telehealth: Payer: Self-pay | Admitting: Pulmonary Disease

## 2017-11-23 ENCOUNTER — Ambulatory Visit (INDEPENDENT_AMBULATORY_CARE_PROVIDER_SITE_OTHER): Payer: Medicare HMO | Admitting: Pulmonary Disease

## 2017-11-23 ENCOUNTER — Encounter: Payer: Self-pay | Admitting: Pulmonary Disease

## 2017-11-23 VITALS — BP 132/84 | HR 108 | Ht 64.0 in | Wt 285.8 lb

## 2017-11-23 DIAGNOSIS — G4733 Obstructive sleep apnea (adult) (pediatric): Secondary | ICD-10-CM

## 2017-11-23 NOTE — Telephone Encounter (Signed)
Noted  

## 2017-11-23 NOTE — Patient Instructions (Signed)
Follow up in 2 months after getting new auto CPAP machine.

## 2017-11-23 NOTE — Telephone Encounter (Signed)
Will route to Dr. Craige CottaSood and Harvin HazelKelli as an Lorain ChildesFYI

## 2017-11-23 NOTE — Progress Notes (Signed)
Patient schedule for follow up today after getting new auto CPAP.  She reports that she has not received new auto CPAP.  Will have her d/w patient care coordinator.  Will not charge for this visit.  Will reschedule follow up for 2 months after she gets CPAP.

## 2017-11-23 NOTE — Telephone Encounter (Signed)
Want to do the cpap due to the smoke in her building I tried to tell her they have filters but she did not want to do it till she moves I gave her a card with my name and number for her to call once she moves and I will send the order back out.

## 2017-12-03 ENCOUNTER — Telehealth: Payer: Self-pay | Admitting: Family Medicine

## 2017-12-03 NOTE — Telephone Encounter (Signed)
Copied from CRM [phone]. Topic: Quick Communication - See Telephone Encounter >> Dec 03, 2017  5:23 PM Terisa Starraylor, Brittany L wrote: CRM for notification. See Telephone encounter for: 12/03/17.  [facility] said that [facility] is giving [facility] a fit with her refilling her lisinopril (PRINIVIL,ZESTRIL) 20 MG tablet & carvedilol (COREG) 12.5 MG tablet. They are saying that they need a new script to have a file. Please advise. They are saying the script they have does not have Dr Elvis CoilJordan's name on it, it has the hospital doctor's name on it. It looks like it was e-scribed on 8/5 but they must not have gotten it.   [facility] [address] - [place], [place] - [address] [place] [phone]

## 2017-12-04 ENCOUNTER — Other Ambulatory Visit: Payer: Self-pay | Admitting: *Deleted

## 2017-12-04 DIAGNOSIS — I110 Hypertensive heart disease with heart failure: Secondary | ICD-10-CM

## 2017-12-04 DIAGNOSIS — I5033 Acute on chronic diastolic (congestive) heart failure: Secondary | ICD-10-CM

## 2017-12-04 MED ORDER — LISINOPRIL 20 MG PO TABS
20.0000 mg | ORAL_TABLET | Freq: Every day | ORAL | 0 refills | Status: AC
Start: 2017-12-04 — End: 2018-03-04

## 2017-12-04 MED ORDER — CARVEDILOL 12.5 MG PO TABS: 12.5000 mg | ORAL_TABLET | Freq: Two times a day (BID) | ORAL | 0 refills | Status: AC

## 2017-12-04 NOTE — Telephone Encounter (Signed)
Rx's resent to pharmacy.

## 2017-12-19 ENCOUNTER — Ambulatory Visit (INDEPENDENT_AMBULATORY_CARE_PROVIDER_SITE_OTHER): Payer: Medicare HMO

## 2017-12-19 ENCOUNTER — Ambulatory Visit (INDEPENDENT_AMBULATORY_CARE_PROVIDER_SITE_OTHER): Payer: Medicare HMO | Admitting: Family Medicine

## 2017-12-19 ENCOUNTER — Encounter: Payer: Self-pay | Admitting: Family Medicine

## 2017-12-19 VITALS — BP 124/80 | HR 88 | Temp 97.7°F | Resp 16 | Ht 64.0 in | Wt 275.4 lb

## 2017-12-19 DIAGNOSIS — R05 Cough: Secondary | ICD-10-CM

## 2017-12-19 DIAGNOSIS — E1165 Type 2 diabetes mellitus with hyperglycemia: Secondary | ICD-10-CM | POA: Diagnosis not present

## 2017-12-19 DIAGNOSIS — R0781 Pleurodynia: Secondary | ICD-10-CM

## 2017-12-19 DIAGNOSIS — N898 Other specified noninflammatory disorders of vagina: Secondary | ICD-10-CM | POA: Diagnosis not present

## 2017-12-19 DIAGNOSIS — Z23 Encounter for immunization: Secondary | ICD-10-CM

## 2017-12-19 DIAGNOSIS — R059 Cough, unspecified: Secondary | ICD-10-CM

## 2017-12-19 LAB — BASIC METABOLIC PANEL
BUN: 23 mg/dL (ref 6–23)
CHLORIDE: 88 meq/L — AB (ref 96–112)
CO2: 31 meq/L (ref 19–32)
CREATININE: 1.27 mg/dL — AB (ref 0.40–1.20)
Calcium: 9.6 mg/dL (ref 8.4–10.5)
GFR: 55.83 mL/min — ABNORMAL LOW (ref >60.00)
Glucose, Bld: 879 mg/dL (ref 70–99)
Potassium: 4.1 mEq/L (ref 3.5–5.1)
Sodium: 128 mEq/L — ABNORMAL LOW (ref 135–145)

## 2017-12-19 LAB — HEMOGLOBIN A1C: HEMOGLOBIN A1C: 11.8 % — AB (ref 4.6–6.5)

## 2017-12-19 MED ORDER — TERCONAZOLE 0.4 % VA CREA: 1.0000 | TOPICAL_CREAM | Freq: Every day | VAGINAL | 1 refills | Status: AC

## 2017-12-19 MED ORDER — INSULIN GLARGINE 100 UNIT/ML SOLOSTAR PEN: 20.0000 [IU] | PEN_INJECTOR | Freq: Every day | SUBCUTANEOUS | 99 refills | Status: AC

## 2017-12-19 MED ORDER — METFORMIN HCL 500 MG PO TABS: 500.0000 mg | ORAL_TABLET | Freq: Two times a day (BID) | ORAL | 1 refills | Status: AC

## 2017-12-19 NOTE — Progress Notes (Signed)
ACUTE VISIT   HPI:  Chief Complaint  Patient presents with  . Right-sided rib pain    underneath ribs, started 2 weeks ago  . Vaginal Itching    started last week    Ms.Renae KEINA MUTCH is a 57 y.o. female, who is here today complaining of right rib cage pain for about 2 weeks. No Hx of trauma but she just moved and has been doing a lot of lifting and cleaning. Soreness, constant 5/10.Yesterday it was 7/10. No radiated.  Pain is exacerbated by getting in her car and when she first gets up in the morning. Alleviated by rest.  She has not noted rash,local edema/erythema,or deformity. She has had similar pain in the past. She wonders if this is caused by eating out for the past few weeks. Eating smaller portions.  She has taken Tums. No associated fever,chills,nausea,vomiting,or changes in bowel habits. Last bowel movement today.  Pain seems to be better today.  She is also concerned about productive cough for 1.5 weeks. Initially she had yellowish sputum and now it is whitish,deneis hemoptysis.  No chest pain,dyspnea,or wheezing. No sick contact.  Vaginal pruritus for about a week. No sexual intercourse sine 2012. She has not noted vaginal discharge or pain.  She has applied baby powder and it has helped,no longer having symptoms.  DM II,she has refused pharmacologic treatment. She is not checking BS's.  +Polydypsia and polyuria. Negative polyphagia,abdominal pain,nausea,or vomiting.   Lab Results  Component Value Date   HGBA1C 8.2 (H) 08/17/2017    Lab Results  Component Value Date   CREATININE 1.12 (H) 09/07/2017   BUN 18 09/07/2017   NA 140 09/07/2017   K 3.5 09/07/2017   CL 97 09/07/2017   CO2 25 09/07/2017    Review of Systems  Constitutional: Positive for appetite change. Negative for activity change, fatigue and fever.  HENT: Negative for mouth sores, nosebleeds, sore throat and trouble swallowing.   Eyes: Negative for redness and  visual disturbance.  Respiratory: Positive for cough. Negative for shortness of breath and wheezing.   Cardiovascular: Negative for chest pain, palpitations and leg swelling.  Gastrointestinal: Negative for abdominal pain, nausea and vomiting.       Negative for changes in bowel habits.  Endocrine: Positive for polyphagia. Negative for polydipsia and polyuria.  Genitourinary: Negative for decreased urine volume, dysuria and hematuria.  Neurological: Negative for seizures, syncope, weakness, numbness and headaches.      Current Outpatient Medications on File Prior to Visit  Medication Sig Dispense Refill  . aspirin EC 81 MG tablet Take 81 mg by mouth daily.     . blood glucose meter kit and supplies KIT Dispense based on patient and insurance preference. Use up to four times daily as directed. (FOR ICD-9 250.00, 250.01). 1 each 0  . carvedilol (COREG) 12.5 MG tablet Take 1 tablet (12.5 mg total) by mouth 2 (two) times daily with a meal. 180 tablet 0  . lisinopril (PRINIVIL,ZESTRIL) 20 MG tablet Take 1 tablet (20 mg total) by mouth daily. 90 tablet 0  . Multiple Vitamin (MULTIVITAMIN WITH MINERALS) TABS tablet Take 1 tablet by mouth daily.    . potassium chloride (K-DUR) 10 MEQ tablet Take 2 tablets (20 mEq total) by mouth daily. 180 tablet 0  . torsemide (DEMADEX) 20 MG tablet Take 1 tablet (20 mg total) by mouth 2 (two) times daily. 180 tablet 2   No current facility-administered medications on file prior to visit.  Past Medical History:  Diagnosis Date  . Allergy   . CHF (congestive heart failure) ([place])   . CHF (congestive heart failure) ([facility])    heart arrhythmia  . Hypertension   . OSA (obstructive sleep apnea)    Allergies  Allergen Reactions  . Peanut-Containing Drug Products Anaphylaxis    Pt states her allergy is to [facility] nuts only-  She does eat other nuts Peanuts are legumes; states she CAN eat peanuts (??)  . Pine Shortness Of Breath    Social History    Socioeconomic History  . Marital status: Single    Spouse name: Not on file  . Number of children: Not on file  . Years of education: Not on file  . Highest education level: Not on file  Occupational History  . Not on file  Social Needs  . Financial resource strain: Not on file  . Food insecurity:    Worry: Not on file    Inability: Not on file  . Transportation needs:    Medical: Not on file    Non-medical: Not on file  Tobacco Use  . Smoking status: Never Smoker  . Smokeless tobacco: Never Used  Substance and Sexual Activity  . Alcohol use: No    Frequency: Never  . Drug use: No  . Sexual activity: Never  Lifestyle  . Physical activity:    Days per week: Not on file    Minutes per session: Not on file  . Stress: Not on file  Relationships  . Social connections:    Talks on phone: Not on file    Gets together: Not on file    Attends religious service: Not on file    Active member of club or organization: Not on file    Attends meetings of clubs or organizations: Not on file    Relationship status: Not on file  Other Topics Concern  . Not on file  Social History Narrative  . Not on file    Vitals:   12/19/17 1020  BP: 124/80  Pulse: 88  Resp: 16  Temp: 97.7 F (36.5 C)  SpO2: 97%   Body mass index is 47.27 kg/m.   Physical Exam  Nursing note and vitals reviewed. Constitutional: She is oriented to person, place, and time. She appears well-developed. No distress.  HENT:  Head: Normocephalic and atraumatic.  Mouth/Throat: Oropharynx is clear and moist and mucous membranes are normal.  Eyes: Pupils are equal, round, and reactive to light. Conjunctivae are normal.  Cardiovascular: Normal rate and regular rhythm.  No murmur heard. Respiratory: Effort normal and breath sounds normal. No respiratory distress. She exhibits tenderness.  GI: Soft. She exhibits no mass. There is no hepatomegaly. There is no tenderness.  Genitourinary:  Genitourinary  Comments: Deferred to gyn.  Musculoskeletal: She exhibits edema (Trace pitting LE edema,bilateral).       Arms: Pain upon palpation of right rib cage, no deformity or crepitus.   Lymphadenopathy:    She has no cervical adenopathy.  Neurological: She is alert and oriented to person, place, and time. She has normal strength. Gait normal.  Skin: Skin is warm. No rash noted. No erythema.  Psychiatric: She has a normal mood and affect.  Well groomed, good eye contact.      ASSESSMENT AND PLAN:   Ms. Gerturde was seen today for right-sided rib pain and vaginal itching.  Orders Placed This Encounter  Procedures  . DG Ribs Unilateral W/Chest Right  . Flu Vaccine QUAD  36+ mos IM  . Hemoglobin A1c  . Basic metabolic panel   Lab Results  Component Value Date   HGBA1C 11.8 (H) 12/19/2017   Lab Results  Component Value Date   CREATININE 1.27 (H) 12/19/2017   BUN 23 12/19/2017   NA 128 (L) 12/19/2017   K 4.1 12/19/2017   CL 88 (L) 12/19/2017   CO2 31 12/19/2017     Costal margin pain  ? Muscle strain. No Hx of direct trauma ,still rib imaging ordered. Recommend avoiding shallow breathing. Topical OTC Icy hot or asper cream with Lidocaine. Further recommendations will be given according to imaging results.  -     DG Ribs Unilateral W/Chest Right; Future  Vaginal pruritus  Hx suggest fungal etiology. Symptoms improved. Diabetes needs to be better controlled to prevent recurrence. Topical antimycotic recommended.  -     terconazole (TERAZOL 7) 0.4 % vaginal cream; Place 1 applicator vaginally at bedtime.  Uncontrolled type 2 diabetes mellitus with hyperglycemia (HCC)  Hg A1C has not been at goal. She does not believe she has DM II. Complications of poorly controlled DM discussed. Clearly instructed about warning signs.  -     Hemoglobin A1c -     Basic metabolic panel   Cough  History of allergy rhinitis, this could be causing the cough. Lung auscultation  today negative. Instructed about warning signs. Further recommendations according to CXR.  Need for immunization against influenza -     Flu Vaccine QUAD 36+ mos IM   Addendum:  Critical lab, glucose 879. She is otherwise asymptomatic,so I think she can start treatment outpt. I recommend starting Lantus 20 U and Metformin 500 mg bid with food. She has refused treatment x 2.     Return in about 4 months (around 04/20/2018) for DM.       Tedrick [place], MD  [facility]. [place] office.

## 2017-12-19 NOTE — Patient Instructions (Signed)
A few things to remember from today's visit:   Costal margin pain - Plan: DG Ribs Unilateral W/Chest Right  Vaginal pruritus - Plan: terconazole (TERAZOL 7) 0.4 % vaginal cream  Uncontrolled type 2 diabetes mellitus with hyperglycemia (HCC) - Plan: Hemoglobin A1c, Basic metabolic panel  Hypertensive heart disease with heart failure (HCC) - Plan: Basic metabolic panel  HgA1C goal < 7.0. Avoid sugar added food:regular soft drinks, energy drinks, and sports drinks. candy. cakes. cookies. pies and cobblers. sweet rolls, pastries, and donuts. fruit drinks, such as fruitades and fruit punch. dairy desserts, such as ice cream  Mediterranean diet has showed benefits for sugar control.  How much and what type of carbohydrate foods are important for managing diabetes. The balance between how much insulin is in your body and the carbohydrate you eat makes a difference in your blood glucose levels.  Fasting blood sugar ideally 130 or less, 2 hours after meals less than 180.   Regular exercise also will help with controlling disease, daily brisk walking as tolerated for 15-30 min definitively will help.   Avoid skipping meals, blood sugar might drop and cause serious problems. Remember checking feet periodically, good dental hygiene, and annual eye exam.     Please be sure medication list is accurate. If a new problem present, please set up appointment sooner than planned today.

## 2017-12-20 ENCOUNTER — Telehealth: Payer: Self-pay | Admitting: Family Medicine

## 2017-12-20 NOTE — Telephone Encounter (Signed)
Pt given results per Dr SwazilandJordan, see result note dated 12/20/17 the pt offered and accepted appointment on 03/20/18 at 1030; also the pt would like diabetes education class;the pt also expresses concern she also says that she can not use a needle for medications; discussed side effects of metformin, and the pt said that "this may not be the pill for her"; explained to pt that this information would be sent to Dr SwazilandJordan for review; [facility]; the pt can be contacted at [phone].

## 2017-12-20 NOTE — Telephone Encounter (Signed)
Returned call to patient. See documentation in result notes.

## 2017-12-20 NOTE — Telephone Encounter (Signed)
Pt needs diabetic education regarding medications and pen use. She is asking for a call back today, per triage nurse that gave her lab results and recommendations. Would you be able to call and speak with her? Or can route for SwazilandJordan review on requests. Thanks!

## 2018-03-03 NOTE — Progress Notes (Deleted)
Cardiology Office Note:    Date:  03/03/2018   ID:  Nash Mantisrecious G Branham, DOB 1960/05/22, MRN [id number]  PCP:  SwazilandJordan, Betty G, MD  Cardiologist:  No primary care provider on file.   Referring MD: SwazilandJordan, Betty G, MD   No chief complaint on file. ***  History of Present Illness:    Robin Oliver is a 57 y.o. female with a hx of HFrEF (20%, 2012) resolved to HFpEF (55%, 2016), essential hypertension, and OSA being seen for the first visit by me.  Past Medical History:  Diagnosis Date  . Allergy   . CHF (congestive heart failure) ([facility])   . CHF (congestive heart failure) ([facility])    heart arrhythmia  . Hypertension   . OSA (obstructive sleep apnea)     No past surgical history on file.  Current Medications: No outpatient medications have been marked as taking for the 03/05/18 encounter (Appointment) with Lyn RecordsSmith, Nuha Degner W, MD.     Allergies:   Peanut-containing drug products and Pine   Social History   Socioeconomic History  . Marital status: Single    Spouse name: Not on file  . Number of children: Not on file  . Years of education: Not on file  . Highest education level: Not on file  Occupational History  . Not on file  Social Needs  . Financial resource strain: Not on file  . Food insecurity:    Worry: Not on file    Inability: Not on file  . Transportation needs:    Medical: Not on file    Non-medical: Not on file  Tobacco Use  . Smoking status: Never Smoker  . Smokeless tobacco: Never Used  Substance and Sexual Activity  . Alcohol use: No    Frequency: Never  . Drug use: No  . Sexual activity: Never  Lifestyle  . Physical activity:    Days per week: Not on file    Minutes per session: Not on file  . Stress: Not on file  Relationships  . Social connections:    Talks on phone: Not on file    Gets together: Not on file    Attends religious service: Not on file    Active member of club or organization: Not on file    Attends meetings of clubs or  organizations: Not on file    Relationship status: Not on file  Other Topics Concern  . Not on file  Social History Narrative  . Not on file     Family History: The patient's ***family history includes Congestive Heart Failure in her brother, mother, and sister; Heart failure in her mother.  ROS:   Please see the history of present illness.    *** All other systems reviewed and are negative.  EKGs/Labs/Other Studies Reviewed:    The following studies were reviewed today: ***  EKG:  EKG is *** ordered today.  The ekg ordered today demonstrates ***  Recent Labs: 05/07/2017: ALT 29 05/16/2017: Pro B Natriuretic peptide (BNP) 83.0 07/11/2017: B Natriuretic Peptide 193.2 07/12/2017: Magnesium 2.0; TSH 0.848 07/15/2017: Hemoglobin 15.0; Platelets 254 12/19/2017: BUN 23; Creatinine, Ser 1.27; Potassium 4.1; Sodium 128  Recent Lipid Panel No results found for: CHOL, TRIG, HDL, CHOLHDL, VLDL, LDLCALC, LDLDIRECT  Physical Exam:    VS:  There were no vitals taken for this visit.    Wt Readings from Last 3 Encounters:  12/19/17 275 lb 6 oz (124.9 kg)  11/23/17 285 lb 12.8 oz (129.6 kg)  10/04/17 287 lb (130.2 kg)     GEN: *** Well nourished, well developed in no acute distress HEENT: Normal NECK: No JVD. LYMPHATICS: No lymphadenopathy CARDIAC: ***RRR, ***murmur, ***gallop, *** edema. VASCULAR: *** pulses. *** bruits. RESPIRATORY:  Clear to auscultation without rales, wheezing or rhonchi  ABDOMEN: Soft, non-tender, non-distended, No pulsatile mass, MUSCULOSKELETAL: No deformity  SKIN: Warm and dry NEUROLOGIC:  Alert and oriented x 3 PSYCHIATRIC:  Normal affect   ASSESSMENT:    1. Acute on chronic diastolic CHF (congestive heart failure) (HCC)   2. Obesity, Class III, BMI 40-49.9 (morbid obesity) (HCC)   3. Uncontrolled type 2 diabetes mellitus with hyperglycemia (HCC)   4. Hypertensive heart disease with heart failure (HCC)   5. OSA (obstructive sleep apnea)   6. Essential  hypertension    PLAN:    In order of problems listed above:  1. ***   Medication Adjustments/Labs and Tests Ordered: Current medicines are reviewed at length with the patient today.  Concerns regarding medicines are outlined above.  No orders of the defined types were placed in this encounter.  No orders of the defined types were placed in this encounter.   There are no Patient Instructions on file for this visit.   Signed, Lesleigh Noe, MD  03/03/2018 5:07 PM    [facility]

## 2018-03-05 ENCOUNTER — Encounter: Payer: Self-pay | Admitting: Interventional Cardiology

## 2018-03-05 ENCOUNTER — Ambulatory Visit: Payer: Medicare HMO | Admitting: Interventional Cardiology

## 2018-03-10 DEATH — deceased

## 2018-03-20 ENCOUNTER — Ambulatory Visit: Payer: Medicare HMO | Admitting: Family Medicine

## 2018-03-20 DIAGNOSIS — Z0289 Encounter for other administrative examinations: Secondary | ICD-10-CM

## 2020-02-23 IMAGING — DX DG RIBS W/ CHEST 3+V*R*
5 series · 5 of 5 positions shown · non-contrast
Comparison: 07/11/2017

CLINICAL DATA: Right-sided rib pain for 2 weeks following heavy
lifting, initial encounter

EXAM:
RIGHT RIBS AND CHEST - 3+ VIEW

[chest pa]
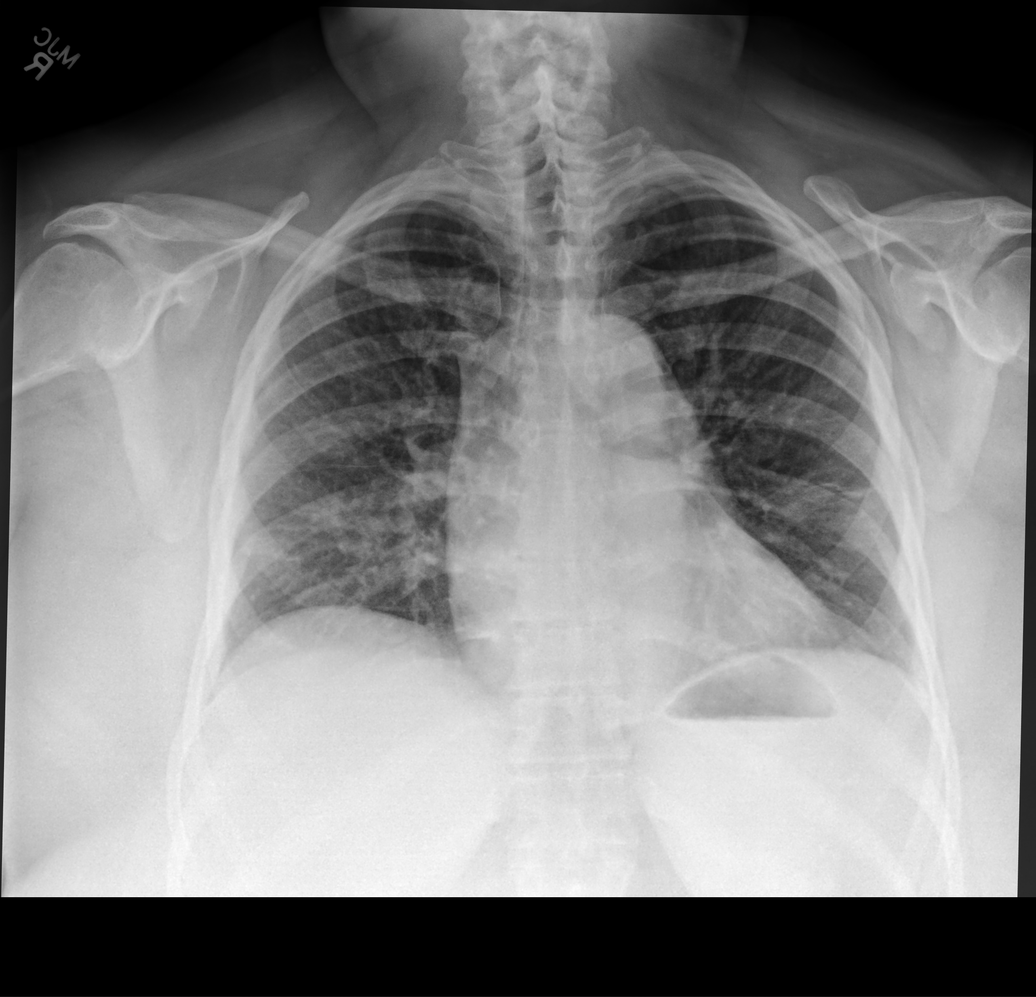

[hemithorax (ribs) ap (1 of 2)]
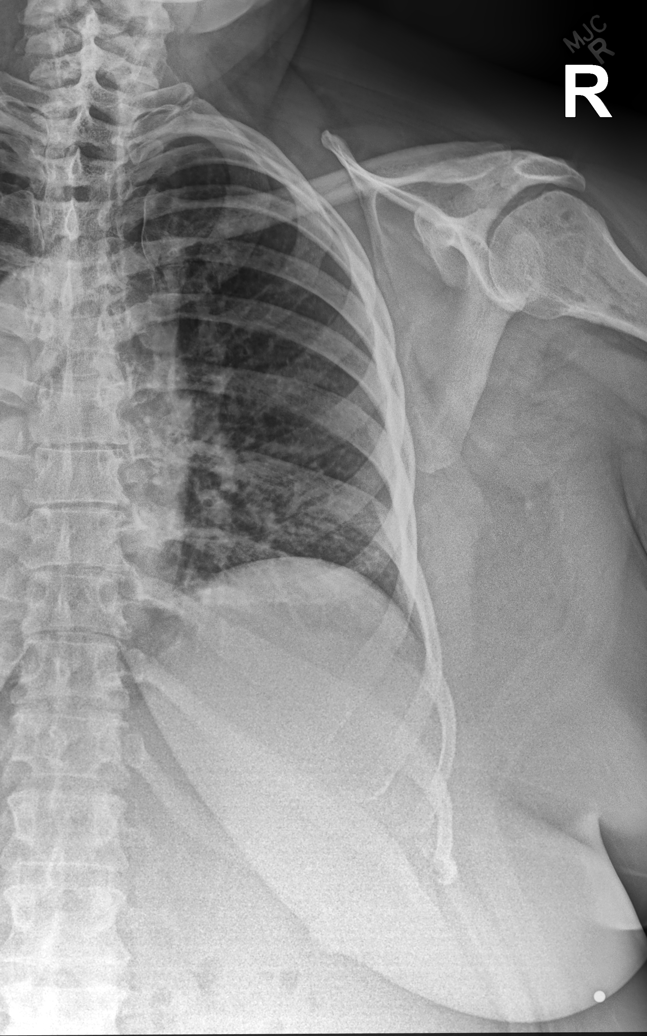

[hemithorax (ribs) mlo (1 of 2)]
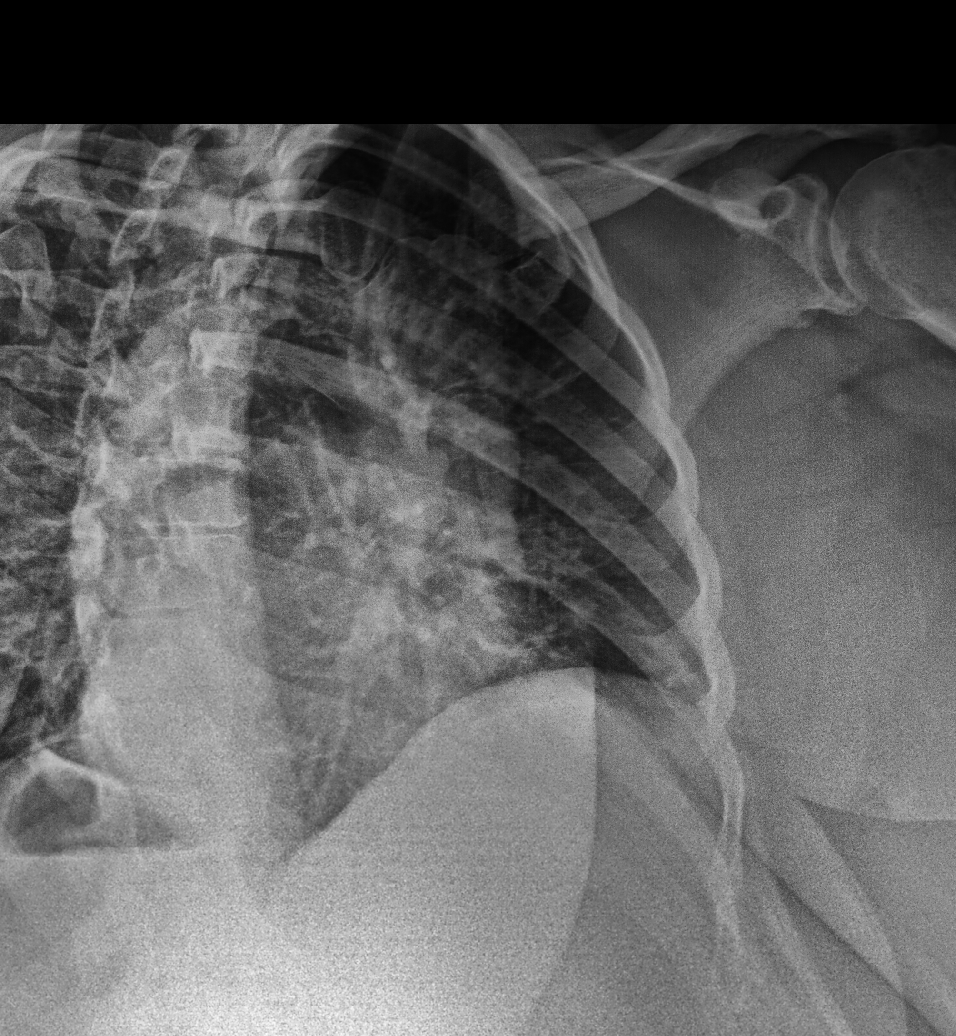

[hemithorax (ribs) ap (2 of 2)]
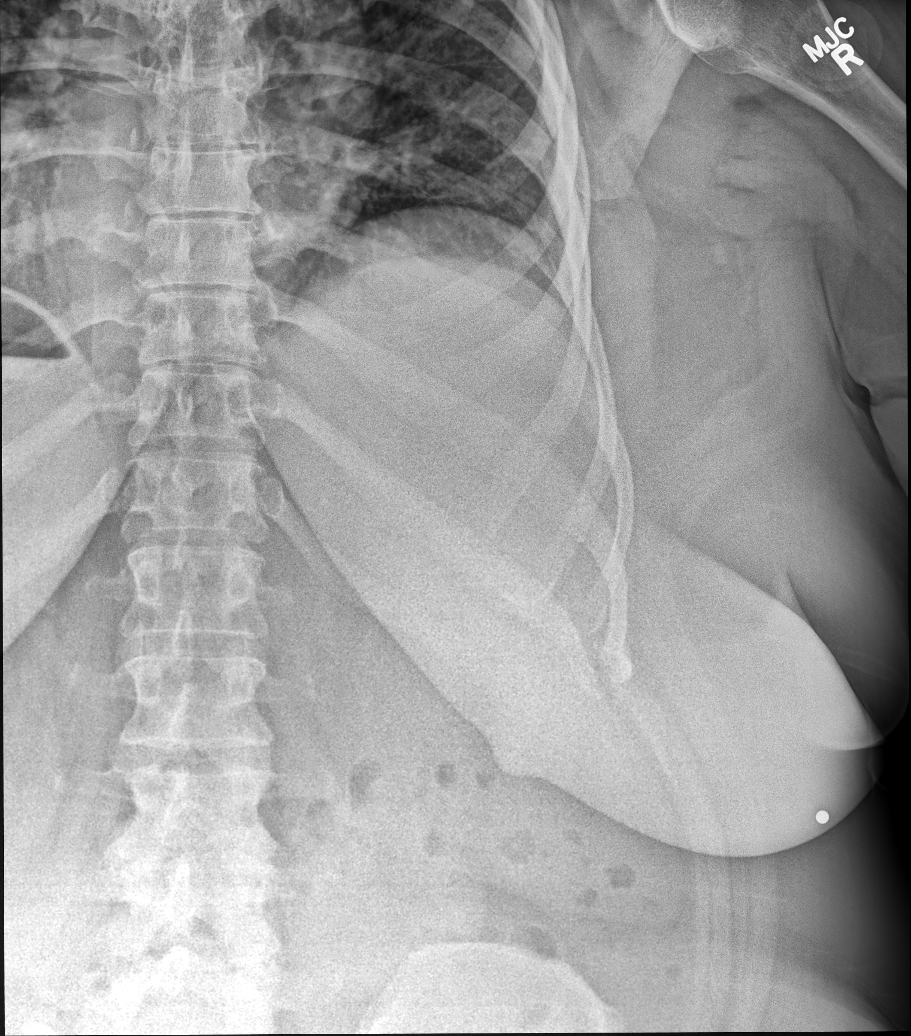

[hemithorax (ribs) mlo (2 of 2)]
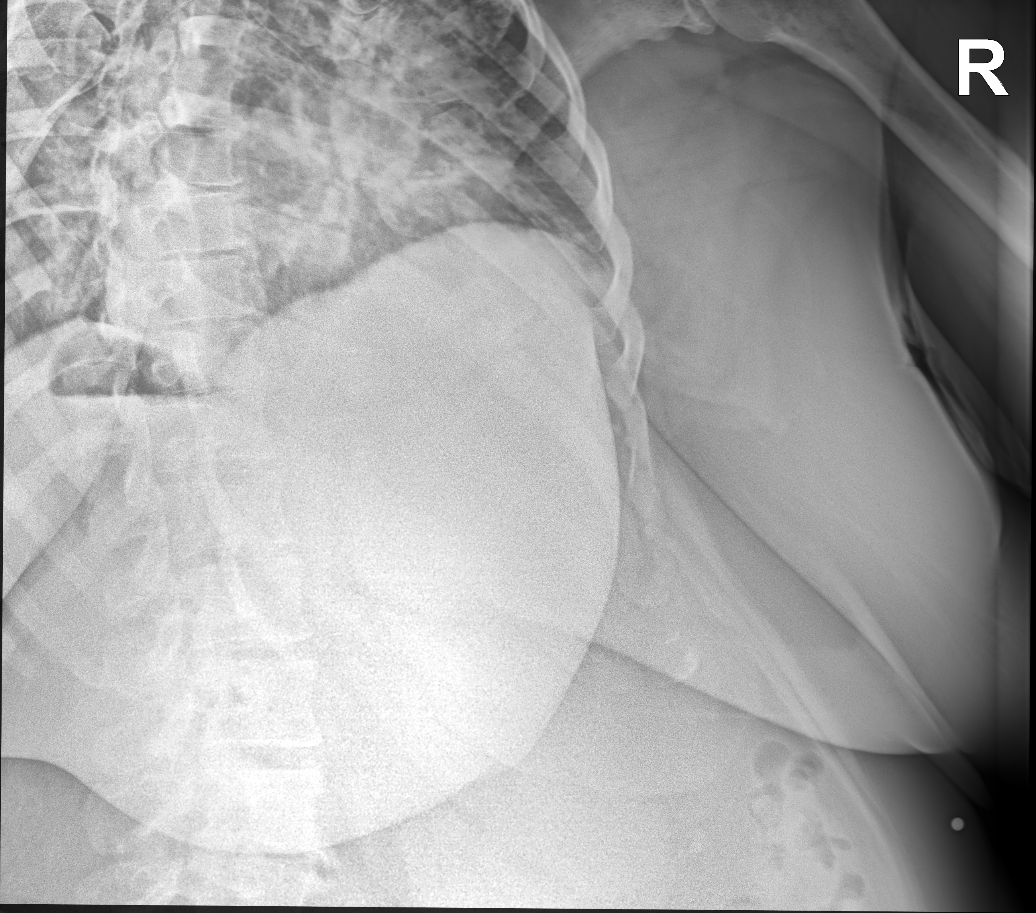

[5 of 5 positions shown; findings below may reference images not displayed]

FINDINGS: Cardiac shadow is within normal limits. The lungs are well aerated
without focal infiltrate or sizable effusion. No pneumothorax is
seen. Mild irregularity of the anterior aspect of the right tenth
rib is noted consistent with a minimally displaced fracture. No
other fractures are seen.
IMPRESSION: Right tenth rib fracture anteriorly without complicating factors.
